# Patient Record
Sex: Female | Born: 1980 | Race: White | Hispanic: No | Marital: Married | Smoking: Never smoker
Health system: Southern US, Community
[De-identification: ages and names within clinical notes are randomized; demographics above are authoritative.]

## PROBLEM LIST (undated history)

## (undated) DIAGNOSIS — F32A Depression, unspecified: Secondary | ICD-10-CM

## (undated) DIAGNOSIS — M329 Systemic lupus erythematosus, unspecified: Secondary | ICD-10-CM

## (undated) DIAGNOSIS — R569 Unspecified convulsions: Secondary | ICD-10-CM

## (undated) DIAGNOSIS — IMO0002 Reserved for concepts with insufficient information to code with codable children: Secondary | ICD-10-CM

## (undated) DIAGNOSIS — N809 Endometriosis, unspecified: Secondary | ICD-10-CM

## (undated) DIAGNOSIS — F329 Major depressive disorder, single episode, unspecified: Secondary | ICD-10-CM

## (undated) DIAGNOSIS — L659 Nonscarring hair loss, unspecified: Secondary | ICD-10-CM

## (undated) DIAGNOSIS — R87629 Unspecified abnormal cytological findings in specimens from vagina: Secondary | ICD-10-CM

## (undated) DIAGNOSIS — D219 Benign neoplasm of connective and other soft tissue, unspecified: Secondary | ICD-10-CM

## (undated) DIAGNOSIS — D649 Anemia, unspecified: Secondary | ICD-10-CM

## (undated) DIAGNOSIS — M545 Low back pain, unspecified: Secondary | ICD-10-CM

## (undated) DIAGNOSIS — R760 Raised antibody titer: Secondary | ICD-10-CM

## (undated) HISTORY — PX: LUMBAR LAMINECTOMY: SHX95

## (undated) HISTORY — DX: Unspecified abnormal cytological findings in specimens from vagina: R87.629

## (undated) HISTORY — DX: Major depressive disorder, single episode, unspecified: F32.9

## (undated) HISTORY — DX: Low back pain, unspecified: M54.50

## (undated) HISTORY — DX: Reserved for concepts with insufficient information to code with codable children: IMO0002

## (undated) HISTORY — DX: Low back pain: M54.5

## (undated) HISTORY — DX: Endometriosis, unspecified: N80.9

## (undated) HISTORY — DX: Benign neoplasm of connective and other soft tissue, unspecified: D21.9

## (undated) HISTORY — PX: OTHER SURGICAL HISTORY: SHX169

## (undated) HISTORY — DX: Depression, unspecified: F32.A

## (undated) HISTORY — DX: Anemia, unspecified: D64.9

## (undated) HISTORY — DX: Raised antibody titer: R76.0

## (undated) HISTORY — PX: DILATION AND CURETTAGE OF UTERUS: SHX78

## (undated) HISTORY — PX: BREAST SURGERY: SHX581

## (undated) HISTORY — DX: Nonscarring hair loss, unspecified: L65.9

## (undated) HISTORY — PX: GASTRIC BYPASS: SHX52

## (undated) HISTORY — PX: BACK SURGERY: SHX140

## (undated) HISTORY — DX: Systemic lupus erythematosus, unspecified: M32.9

---

## 2013-01-04 ENCOUNTER — Encounter (INDEPENDENT_AMBULATORY_CARE_PROVIDER_SITE_OTHER): Payer: Self-pay | Admitting: Ophthalmology

## 2013-01-20 ENCOUNTER — Emergency Department (INDEPENDENT_AMBULATORY_CARE_PROVIDER_SITE_OTHER)
Admission: EM | Admit: 2013-01-20 | Discharge: 2013-01-20 | Discharge status: Against Medical Advice | Payer: BC Managed Care – PPO | Source: Home / Self Care

## 2013-01-20 ENCOUNTER — Encounter (INDEPENDENT_AMBULATORY_CARE_PROVIDER_SITE_OTHER): Payer: Self-pay | Admitting: Ophthalmology

## 2013-01-20 ENCOUNTER — Encounter (HOSPITAL_COMMUNITY): Payer: Self-pay

## 2013-01-20 DIAGNOSIS — M329 Systemic lupus erythematosus, unspecified: Secondary | ICD-10-CM

## 2013-01-20 HISTORY — DX: Reserved for concepts with insufficient information to code with codable children: IMO0002

## 2013-01-20 HISTORY — DX: Systemic lupus erythematosus, unspecified: M32.9

## 2013-01-20 LAB — POCT URINALYSIS DIP (DEVICE)
Bilirubin Urine: NEGATIVE
Glucose, UA: NEGATIVE mg/dL
Ketones, ur: >=160 mg/dL — AB
Specific Gravity, Urine: 1.02 (ref 1.005–1.030)
Urobilinogen, UA: 0.2 mg/dL (ref 0.0–1.0)

## 2013-01-20 LAB — POCT I-STAT, CHEM 8
BUN: 9 mg/dL (ref 6–23)
Calcium, Ion: 1.19 mmol/L (ref 1.12–1.23)
Chloride: 109 mEq/L (ref 96–112)
HCT: 41 % (ref 36.0–46.0)
Potassium: 3.8 mEq/L (ref 3.5–5.1)
Sodium: 138 mEq/L (ref 135–145)

## 2013-01-20 MED ORDER — PREDNISONE 20 MG PO TABS: 20.0000 mg | ORAL_TABLET | Freq: Three times a day (TID) | ORAL | Status: DC

## 2013-01-20 MED ORDER — LANSOPRAZOLE 30 MG PO CPDR: 30.0000 mg | DELAYED_RELEASE_CAPSULE | Freq: Every day | ORAL | Status: DC

## 2013-01-20 NOTE — ED Provider Notes (Signed)
History     CSN: [id number]  Arrival date & time 01/20/13  1450   None     Chief Complaint  Patient presents with  . Lupus    (Consider location/radiation/quality/duration/timing/severity/associated sxs/prior treatment) HPI Comments: Pt presents c/o severe lupus flare that started yesterday.  She has pain all over, dizziness, fatigue, confusion, and has not urinated at all today.     Past Medical History  Diagnosis Date  . Lupus     Past Surgical History  Procedure Laterality Date  . Back surgery    . Gastric bypass      History reviewed. No pertinent family history.  History  Substance Use Topics  . Smoking status: Never Smoker   . Smokeless tobacco: Not on file  . Alcohol Use: No    OB History   Grav Para Term Preterm Abortions TAB SAB Ect Mult Living                  Review of Systems  Constitutional: Positive for activity change and fatigue. Negative for fever and chills.  Eyes: Positive for visual disturbance.  Respiratory: Negative for cough and shortness of breath.   Cardiovascular: Negative for chest pain, palpitations and leg swelling.  Gastrointestinal: Positive for nausea and abdominal pain. Negative for vomiting.  Endocrine: Negative for polydipsia and polyuria.  Genitourinary: Positive for decreased urine volume. Negative for dysuria, urgency and frequency.  Musculoskeletal: Positive for myalgias and arthralgias.  Skin: Negative for rash.  Neurological: Positive for dizziness, light-headedness and headaches. Negative for weakness.  Psychiatric/Behavioral: Positive for confusion and decreased concentration.    Allergies  Review of patient's allergies indicates no known allergies.  Home Medications   Current Outpatient Rx  Name  Route  Sig  Dispense  Refill  . hydroxychloroquine (PLAQUENIL) 200 MG tablet   Oral   Take by mouth daily.         . predniSONE (DELTASONE) 10 MG tablet   Oral   Take 10 mg by mouth daily.         .  lansoprazole (PREVACID) 30 MG capsule   Oral   Take 1 capsule (30 mg total) by mouth daily.   30 capsule   0   . predniSONE (DELTASONE) 20 MG tablet   Oral   Take 1 tablet (20 mg total) by mouth 3 (three) times daily.   21 tablet   0     BP 127/81  Pulse 110  Temp(Src) 98.7 F (37.1 C) (Oral)  Resp 22  SpO2 100%  LMP 12/30/2012  Physical Exam  Nursing note and vitals reviewed. Constitutional: She is oriented to person, place, and time. Vital signs are normal. She appears well-developed and well-nourished. She appears distressed.  Eyes: EOM are normal.  Cardiovascular: Normal rate, regular rhythm and normal heart sounds.  Exam reveals no gallop and no friction rub.   No murmur heard. Pulmonary/Chest: Effort normal and breath sounds normal. No respiratory distress. She has no wheezes. She has no rales.  Neurological: She is alert and oriented to person, place, and time. She has normal strength.  Skin: Skin is warm and dry. She is not diaphoretic.  Psychiatric: She has a normal mood and affect. Her behavior is normal.    ED Course  Procedures (including critical care time)  Labs Reviewed  POCT URINALYSIS DIP (DEVICE) - Abnormal; Notable for the following:    Ketones, ur >=160 (*)    Hgb urine dipstick TRACE (*)    Leukocytes, UA  TRACE (*)    All other components within normal limits  POCT I-STAT, CHEM 8   No results found.   1. Exacerbation of systemic lupus erythematosus       MDM   Discussed with rheumatologist who recommended admission.  Pt decided against this and signed out AMA.  She says she will f/u with her rheumatologist on Monday.  Told her she may come back or go to ER if she needs to.  Will still attempt to treat her with increased dose of prednisone although made it plain and clear to her and her family that the recommendation of her rheumatologist and myself is that she needs to be treated in the hospital.  She accepts all risks associated with this  and still will not go to hospital.    Meds ordered this encounter  Medications  .       .       . predniSONE (DELTASONE) 20 MG tablet    Sig: Take 1 tablet (20 mg total) by mouth 3 (three) times daily.    Dispense:  21 tablet    Refill:  0  . lansoprazole (PREVACID) 30 MG capsule    Sig: Take 1 capsule (30 mg total) by mouth daily.    Dispense:  30 capsule    Refill:  0          Graylon Good, PA-C 01/20/13 [phone]

## 2013-01-20 NOTE — ED Notes (Signed)
States she is having a flare up of her lupus since last PM, getting worse. Almost fainted after she took hot shower

## 2013-01-21 NOTE — ED Provider Notes (Signed)
Medical screening examination/treatment/procedure(s) were performed by resident physician or non-physician practitioner and as supervising physician I was immediately available for consultation/collaboration.   Barkley Bruns MD.   Linna Hoff, MD 01/21/13 [phone]

## 2013-03-30 ENCOUNTER — Ambulatory Visit (INDEPENDENT_AMBULATORY_CARE_PROVIDER_SITE_OTHER): Payer: BC Managed Care – PPO | Admitting: Licensed Clinical Social Worker

## 2013-03-30 DIAGNOSIS — F411 Generalized anxiety disorder: Secondary | ICD-10-CM

## 2013-03-30 DIAGNOSIS — F331 Major depressive disorder, recurrent, moderate: Secondary | ICD-10-CM

## 2013-04-10 ENCOUNTER — Ambulatory Visit (INDEPENDENT_AMBULATORY_CARE_PROVIDER_SITE_OTHER): Payer: BC Managed Care – PPO | Admitting: Licensed Clinical Social Worker

## 2013-04-10 DIAGNOSIS — F411 Generalized anxiety disorder: Secondary | ICD-10-CM

## 2013-04-10 DIAGNOSIS — F331 Major depressive disorder, recurrent, moderate: Secondary | ICD-10-CM

## 2013-04-27 ENCOUNTER — Ambulatory Visit: Payer: BC Managed Care – PPO | Admitting: Licensed Clinical Social Worker

## 2013-05-02 ENCOUNTER — Institutional Professional Consult (permissible substitution): Payer: Self-pay | Admitting: Cardiology

## 2013-05-09 ENCOUNTER — Encounter: Payer: Self-pay | Admitting: Gastroenterology

## 2013-05-14 ENCOUNTER — Encounter (HOSPITAL_COMMUNITY): Payer: Self-pay | Admitting: Emergency Medicine

## 2013-05-14 ENCOUNTER — Emergency Department (HOSPITAL_COMMUNITY)
Admission: EM | Admit: 2013-05-14 | Discharge: 2013-05-14 | Disposition: A | Discharge status: Home / Self Care | Payer: BC Managed Care – PPO | Attending: Emergency Medicine | Admitting: Emergency Medicine

## 2013-05-14 ENCOUNTER — Emergency Department (HOSPITAL_COMMUNITY): Payer: BC Managed Care – PPO

## 2013-05-14 DIAGNOSIS — Z79899 Other long term (current) drug therapy: Secondary | ICD-10-CM | POA: Insufficient documentation

## 2013-05-14 DIAGNOSIS — M545 Low back pain, unspecified: Secondary | ICD-10-CM

## 2013-05-14 DIAGNOSIS — W19XXXA Unspecified fall, initial encounter: Secondary | ICD-10-CM

## 2013-05-14 DIAGNOSIS — Y9389 Activity, other specified: Secondary | ICD-10-CM | POA: Insufficient documentation

## 2013-05-14 DIAGNOSIS — W11XXXA Fall on and from ladder, initial encounter: Secondary | ICD-10-CM | POA: Insufficient documentation

## 2013-05-14 DIAGNOSIS — IMO0002 Reserved for concepts with insufficient information to code with codable children: Secondary | ICD-10-CM | POA: Insufficient documentation

## 2013-05-14 DIAGNOSIS — S8990XA Unspecified injury of unspecified lower leg, initial encounter: Secondary | ICD-10-CM | POA: Insufficient documentation

## 2013-05-14 DIAGNOSIS — Z791 Long term (current) use of non-steroidal anti-inflammatories (NSAID): Secondary | ICD-10-CM | POA: Insufficient documentation

## 2013-05-14 DIAGNOSIS — Z872 Personal history of diseases of the skin and subcutaneous tissue: Secondary | ICD-10-CM | POA: Insufficient documentation

## 2013-05-14 DIAGNOSIS — Y92009 Unspecified place in unspecified non-institutional (private) residence as the place of occurrence of the external cause: Secondary | ICD-10-CM | POA: Insufficient documentation

## 2013-05-14 HISTORY — DX: Systemic lupus erythematosus, unspecified: M32.9

## 2013-05-14 MED ORDER — CYCLOBENZAPRINE HCL 10 MG PO TABS: 5.0000 mg | ORAL_TABLET | Freq: Two times a day (BID) | ORAL | Status: DC | PRN

## 2013-05-14 MED ORDER — CYCLOBENZAPRINE HCL 10 MG PO TABS
5.0000 mg | ORAL_TABLET | Freq: Once | ORAL | Status: AC
  Administered 2013-05-14: 5 mg via ORAL
  Filled 2013-05-14: qty 1

## 2013-05-14 MED ORDER — HYDROCODONE-ACETAMINOPHEN 5-325 MG PO TABS: 1.0000 | ORAL_TABLET | ORAL | Status: DC | PRN

## 2013-05-14 MED ORDER — HYDROCODONE-ACETAMINOPHEN 5-325 MG PO TABS
1.0000 | ORAL_TABLET | Freq: Once | ORAL | Status: AC
  Administered 2013-05-14: 1 via ORAL
  Filled 2013-05-14: qty 1

## 2013-05-14 MED ORDER — ONDANSETRON 4 MG PO TBDP
4.0000 mg | ORAL_TABLET | Freq: Once | ORAL | Status: AC
  Administered 2013-05-14: 4 mg via ORAL
  Filled 2013-05-14: qty 1

## 2013-05-14 NOTE — ED Provider Notes (Signed)
Medical screening examination/treatment/procedure(s) were performed by non-physician practitioner and as supervising physician I was immediately available for consultation/collaboration.   Kjirsten Bloodgood B. Bernette Mayers, MD 05/14/13 1929

## 2013-05-14 NOTE — ED Provider Notes (Signed)
CSN: [id number]     Arrival date & time 05/14/13  1736 History  This chart was scribed for Kathleen Pel, PA, working with Leonette Most B. Kathleen Mayers, MD by Blanchard Kelch, ED Scribe. This patient was seen in room WTR5/WTR5 and the patient's care was started at 7:13 PM.     Chief Complaint  Patient presents with  . Fall    Patient is a 32 y.o. female presenting with fall. The history is provided by the patient. No language interpreter was used.  Fall    HPI Comments: Kathleen Ashley is a 32 y.o. female who presents to the Emergency Department due to a fall off a ladder while she was painting just prior to arrival. She is now complaining of sudden, constant right lower back and right leg pain onset after the fall. The pain is worse in her leg when she walks or puts any pressure on it. She has a past surgical history on her back. Dr. Yetta Barre performed her surgery. Denies head injury, neck pain. Or loc. She is able to walk. No loss of bowel or urine control. Pt is concerned that she may have "messed up her back surgery".     Past Medical History  Diagnosis Date  . Lupus    Past Surgical History  Procedure Laterality Date  . Back surgery     No family history on file. History  Substance Use Topics  . Smoking status: Never Smoker   . Smokeless tobacco: Not on file  . Alcohol Use: No   OB History   Grav Para Term Preterm Abortions TAB SAB Ect Mult Living                 Review of Systems  Musculoskeletal: Positive for back pain.  All other systems reviewed and are negative.    Allergies  Review of patient's allergies indicates no known allergies.  Home Medications   Current Outpatient Rx  Name  Route  Sig  Dispense  Refill  . acetaminophen (TYLENOL) 500 MG tablet   Oral   Take 1,000 mg by mouth every 6 (six) hours as needed for pain.         . celecoxib (CELEBREX) 200 MG capsule   Oral   Take 200 mg by mouth 2 (two) times daily.         . hydroxychloroquine (PLAQUENIL)  200 MG tablet   Oral   Take 400 mg by mouth at bedtime.         . Multiple Vitamin (MULTIVITAMIN WITH MINERALS) TABS tablet   Oral   Take 1 tablet by mouth daily.          Triage Vitals: BP 130/70  Pulse 105  Temp(Src) 98.7 F (37.1 C) (Oral)  Resp 18  SpO2 99%  LMP 04/29/2013  Physical Exam  Nursing note and vitals reviewed. Constitutional: She is oriented to person, place, and time. She appears well-developed and well-nourished. No distress.  HENT:  Head: Normocephalic and atraumatic.  Eyes: EOM are normal.  Neck: Neck supple. No tracheal deviation present.  Cardiovascular: Normal rate.   Pulmonary/Chest: Effort normal. No respiratory distress.  Musculoskeletal: Normal range of motion. She exhibits tenderness.  Bilateral paraspinal tenderness. No deformity of spine. No step off. No midline tenderness.   Neurological: She is alert and oriented to person, place, and time. She has normal strength. No sensory deficit.  Skin: Skin is warm and dry.  Psychiatric: She has a normal mood and affect. Her behavior is normal.  ED Course  Procedures (including critical care time)  DIAGNOSTIC STUDIES: Oxygen Saturation is 99% on room air, normal by my interpretation.    COORDINATION OF CARE:  7:15 PM -Will order muscle relaxer and pain medication. Recommend follow up with Dr. Yetta Barre. Patient verbalizes understanding and agrees with treatment plan.   Labs Review Labs Reviewed - No data to display Imaging Review Dg Lumbar Spine Complete  05/14/2013   CLINICAL DATA:  Low back pain  EXAM: LUMBAR SPINE - COMPLETE 4+ VIEW  COMPARISON:  None.  FINDINGS: There is no evidence of lumbar spine fracture. Alignment is normal. L5-S1 degenerative disc disease noted.  IMPRESSION: 1. No acute findings.  2. L5-S1 degenerative disc disease   Electronically Signed   By: Signa Kell M.D.   On: 05/14/2013 18:38    MDM   1. Fall at home, initial encounter   2. Low back pain     Equal  strength to bilateral lower extremities. Neurosensory function adequate to both legs. Skin color is normal. Skin is warm and moist. I see no step off deformity, no bony tenderness. Pt is able to ambulate without limp. Pain is relieved when sitting in certain positions. ROM is decreased due to pain. No crepitus, laceration, effusion, swelling.  Pulses are normal   31 y.o.Kathleen Ashley's evaluation in the Emergency Department is complete. It has been determined that no acute conditions requiring further emergency intervention are present at this time. The patient/guardian have been advised of the diagnosis and plan. We have discussed signs and symptoms that warrant return to the ED, such as changes or worsening in symptoms.  Vital signs are stable at discharge. Filed Vitals:   05/14/13 1814  BP: 130/70  Pulse: 105  Temp: 98.7 F (37.1 C)  Resp: 18    Patient/guardian has voiced understanding and agreed to follow-up with the PCP or specialist.  I personally performed the services described in this documentation, which was scribed in my presence. The recorded information has been reviewed and is accurate.    Dorthula Matas, PA-C 05/14/13 1920

## 2013-05-14 NOTE — ED Notes (Signed)
Per pt, was on last step of ladder and fell on right side-now having back pain-recent back surgery

## 2013-05-14 NOTE — ED Notes (Signed)
Patient transported to X-ray 

## 2013-06-27 ENCOUNTER — Institutional Professional Consult (permissible substitution): Payer: Self-pay | Admitting: Cardiology

## 2013-07-03 ENCOUNTER — Ambulatory Visit: Payer: BC Managed Care – PPO | Admitting: Gastroenterology

## 2013-12-06 ENCOUNTER — Ambulatory Visit (INDEPENDENT_AMBULATORY_CARE_PROVIDER_SITE_OTHER): Payer: BC Managed Care – PPO | Admitting: Licensed Clinical Social Worker

## 2013-12-06 DIAGNOSIS — F411 Generalized anxiety disorder: Secondary | ICD-10-CM

## 2013-12-06 DIAGNOSIS — F331 Major depressive disorder, recurrent, moderate: Secondary | ICD-10-CM

## 2013-12-12 ENCOUNTER — Ambulatory Visit (INDEPENDENT_AMBULATORY_CARE_PROVIDER_SITE_OTHER): Payer: BC Managed Care – PPO | Admitting: Licensed Clinical Social Worker

## 2013-12-12 DIAGNOSIS — F411 Generalized anxiety disorder: Secondary | ICD-10-CM

## 2013-12-12 DIAGNOSIS — F331 Major depressive disorder, recurrent, moderate: Secondary | ICD-10-CM

## 2013-12-20 ENCOUNTER — Ambulatory Visit: Payer: BC Managed Care – PPO | Admitting: Licensed Clinical Social Worker

## 2013-12-21 ENCOUNTER — Ambulatory Visit (INDEPENDENT_AMBULATORY_CARE_PROVIDER_SITE_OTHER): Payer: BC Managed Care – PPO | Admitting: Licensed Clinical Social Worker

## 2013-12-21 DIAGNOSIS — F411 Generalized anxiety disorder: Secondary | ICD-10-CM

## 2013-12-21 DIAGNOSIS — F331 Major depressive disorder, recurrent, moderate: Secondary | ICD-10-CM

## 2014-01-05 ENCOUNTER — Ambulatory Visit: Payer: BC Managed Care – PPO | Admitting: Licensed Clinical Social Worker

## 2014-01-16 ENCOUNTER — Ambulatory Visit: Payer: BC Managed Care – PPO | Admitting: Licensed Clinical Social Worker

## 2014-01-17 ENCOUNTER — Ambulatory Visit: Payer: BC Managed Care – PPO | Admitting: Licensed Clinical Social Worker

## 2014-01-24 ENCOUNTER — Ambulatory Visit: Payer: BC Managed Care – PPO | Admitting: Family

## 2014-01-24 ENCOUNTER — Ambulatory Visit (INDEPENDENT_AMBULATORY_CARE_PROVIDER_SITE_OTHER): Payer: BC Managed Care – PPO | Admitting: Licensed Clinical Social Worker

## 2014-01-24 DIAGNOSIS — F411 Generalized anxiety disorder: Secondary | ICD-10-CM

## 2014-01-24 DIAGNOSIS — F331 Major depressive disorder, recurrent, moderate: Secondary | ICD-10-CM

## 2014-01-30 ENCOUNTER — Ambulatory Visit: Payer: BC Managed Care – PPO | Admitting: Licensed Clinical Social Worker

## 2014-01-31 ENCOUNTER — Ambulatory Visit (INDEPENDENT_AMBULATORY_CARE_PROVIDER_SITE_OTHER): Payer: BC Managed Care – PPO | Admitting: Licensed Clinical Social Worker

## 2014-01-31 DIAGNOSIS — F331 Major depressive disorder, recurrent, moderate: Secondary | ICD-10-CM

## 2014-01-31 DIAGNOSIS — F411 Generalized anxiety disorder: Secondary | ICD-10-CM

## 2014-02-08 ENCOUNTER — Ambulatory Visit: Payer: BC Managed Care – PPO | Admitting: Family

## 2014-02-08 ENCOUNTER — Ambulatory Visit: Payer: BC Managed Care – PPO | Admitting: Licensed Clinical Social Worker

## 2014-02-08 DIAGNOSIS — Z0289 Encounter for other administrative examinations: Secondary | ICD-10-CM

## 2014-02-14 ENCOUNTER — Ambulatory Visit (INDEPENDENT_AMBULATORY_CARE_PROVIDER_SITE_OTHER): Payer: BC Managed Care – PPO | Admitting: Licensed Clinical Social Worker

## 2014-02-14 DIAGNOSIS — F331 Major depressive disorder, recurrent, moderate: Secondary | ICD-10-CM

## 2014-02-14 DIAGNOSIS — F411 Generalized anxiety disorder: Secondary | ICD-10-CM

## 2014-02-20 ENCOUNTER — Ambulatory Visit (INDEPENDENT_AMBULATORY_CARE_PROVIDER_SITE_OTHER): Payer: BC Managed Care – PPO | Admitting: Licensed Clinical Social Worker

## 2014-02-20 DIAGNOSIS — F331 Major depressive disorder, recurrent, moderate: Secondary | ICD-10-CM

## 2014-02-20 DIAGNOSIS — F411 Generalized anxiety disorder: Secondary | ICD-10-CM

## 2014-04-03 ENCOUNTER — Ambulatory Visit: Payer: BC Managed Care – PPO | Admitting: Licensed Clinical Social Worker

## 2014-06-06 ENCOUNTER — Ambulatory Visit: Payer: BC Managed Care – PPO | Admitting: Neurology

## 2014-09-06 ENCOUNTER — Ambulatory Visit: Payer: Self-pay | Admitting: Licensed Clinical Social Worker

## 2014-12-11 ENCOUNTER — Ambulatory Visit: Payer: Self-pay | Admitting: Licensed Clinical Social Worker

## 2014-12-18 ENCOUNTER — Ambulatory Visit (INDEPENDENT_AMBULATORY_CARE_PROVIDER_SITE_OTHER): Payer: BLUE CROSS/BLUE SHIELD | Admitting: Licensed Clinical Social Worker

## 2014-12-18 DIAGNOSIS — F411 Generalized anxiety disorder: Secondary | ICD-10-CM | POA: Diagnosis not present

## 2014-12-18 DIAGNOSIS — F332 Major depressive disorder, recurrent severe without psychotic features: Secondary | ICD-10-CM | POA: Diagnosis not present

## 2015-05-01 ENCOUNTER — Ambulatory Visit: Payer: BLUE CROSS/BLUE SHIELD | Admitting: Licensed Clinical Social Worker

## 2015-07-09 ENCOUNTER — Other Ambulatory Visit: Payer: Self-pay | Admitting: Orthopedic Surgery

## 2015-07-09 DIAGNOSIS — M5417 Radiculopathy, lumbosacral region: Secondary | ICD-10-CM

## 2015-07-11 ENCOUNTER — Other Ambulatory Visit: Payer: Self-pay | Admitting: Rheumatology

## 2015-07-11 ENCOUNTER — Ambulatory Visit
Admission: RE | Admit: 2015-07-11 | Discharge: 2015-07-11 | Disposition: A | Discharge status: Home / Self Care | Payer: BLUE CROSS/BLUE SHIELD | Source: Ambulatory Visit | Attending: Rheumatology | Admitting: Rheumatology

## 2015-07-11 DIAGNOSIS — Z5181 Encounter for therapeutic drug level monitoring: Secondary | ICD-10-CM

## 2015-07-11 DIAGNOSIS — Z79899 Other long term (current) drug therapy: Principal | ICD-10-CM

## 2015-07-13 ENCOUNTER — Ambulatory Visit
Admission: RE | Admit: 2015-07-13 | Discharge: 2015-07-13 | Disposition: A | Discharge status: Home / Self Care | Payer: BLUE CROSS/BLUE SHIELD | Source: Ambulatory Visit | Attending: Orthopedic Surgery | Admitting: Orthopedic Surgery

## 2015-07-13 DIAGNOSIS — M5417 Radiculopathy, lumbosacral region: Secondary | ICD-10-CM

## 2015-07-14 ENCOUNTER — Other Ambulatory Visit: Payer: Self-pay

## 2015-08-31 ENCOUNTER — Encounter (HOSPITAL_COMMUNITY): Payer: Self-pay | Admitting: Emergency Medicine

## 2015-08-31 ENCOUNTER — Emergency Department (HOSPITAL_COMMUNITY): Payer: BLUE CROSS/BLUE SHIELD

## 2015-08-31 ENCOUNTER — Emergency Department (HOSPITAL_COMMUNITY)
Admission: EM | Admit: 2015-08-31 | Discharge: 2015-08-31 | Disposition: A | Discharge status: Home / Self Care | Payer: BLUE CROSS/BLUE SHIELD | Attending: Emergency Medicine | Admitting: Emergency Medicine

## 2015-08-31 DIAGNOSIS — Y998 Other external cause status: Secondary | ICD-10-CM | POA: Diagnosis not present

## 2015-08-31 DIAGNOSIS — W000XXA Fall on same level due to ice and snow, initial encounter: Secondary | ICD-10-CM | POA: Insufficient documentation

## 2015-08-31 DIAGNOSIS — Z3202 Encounter for pregnancy test, result negative: Secondary | ICD-10-CM | POA: Diagnosis not present

## 2015-08-31 DIAGNOSIS — R159 Full incontinence of feces: Secondary | ICD-10-CM | POA: Diagnosis not present

## 2015-08-31 DIAGNOSIS — W009XXA Unspecified fall due to ice and snow, initial encounter: Secondary | ICD-10-CM

## 2015-08-31 DIAGNOSIS — S3992XA Unspecified injury of lower back, initial encounter: Secondary | ICD-10-CM | POA: Diagnosis present

## 2015-08-31 DIAGNOSIS — R2689 Other abnormalities of gait and mobility: Secondary | ICD-10-CM | POA: Insufficient documentation

## 2015-08-31 DIAGNOSIS — R531 Weakness: Secondary | ICD-10-CM | POA: Insufficient documentation

## 2015-08-31 DIAGNOSIS — Y93K1 Activity, walking an animal: Secondary | ICD-10-CM | POA: Insufficient documentation

## 2015-08-31 DIAGNOSIS — M5441 Lumbago with sciatica, right side: Secondary | ICD-10-CM | POA: Insufficient documentation

## 2015-08-31 DIAGNOSIS — Z79899 Other long term (current) drug therapy: Secondary | ICD-10-CM | POA: Diagnosis not present

## 2015-08-31 DIAGNOSIS — Y9289 Other specified places as the place of occurrence of the external cause: Secondary | ICD-10-CM | POA: Diagnosis not present

## 2015-08-31 LAB — POC URINE PREG, ED: Preg Test, Ur: NEGATIVE

## 2015-08-31 MED ORDER — METHOCARBAMOL 500 MG PO TABS: 500.0000 mg | ORAL_TABLET | Freq: Two times a day (BID) | ORAL | Status: DC

## 2015-08-31 MED ORDER — LORAZEPAM 1 MG PO TABS
1.0000 mg | ORAL_TABLET | Freq: Once | ORAL | Status: AC
  Administered 2015-08-31: 1 mg via ORAL
  Filled 2015-08-31: qty 1

## 2015-08-31 MED ORDER — OXYCODONE-ACETAMINOPHEN 5-325 MG PO TABS
2.0000 | ORAL_TABLET | ORAL | Status: DC | PRN
Start: 2015-08-31 — End: 2017-08-12

## 2015-08-31 MED ORDER — ONDANSETRON HCL 4 MG/2ML IJ SOLN
4.0000 mg | Freq: Once | INTRAMUSCULAR | Status: AC
  Administered 2015-08-31: 4 mg via INTRAVENOUS
  Filled 2015-08-31: qty 2

## 2015-08-31 MED ORDER — MORPHINE SULFATE (PF) 4 MG/ML IV SOLN
4.0000 mg | Freq: Once | INTRAVENOUS | Status: AC
  Administered 2015-08-31: 4 mg via INTRAVENOUS
  Filled 2015-08-31: qty 1

## 2015-08-31 MED ORDER — ONDANSETRON HCL 4 MG/2ML IJ SOLN
4.0000 mg | Freq: Once | INTRAMUSCULAR | Status: AC
Start: 2015-08-31 — End: 2015-08-31
  Administered 2015-08-31: 4 mg via INTRAVENOUS
  Filled 2015-08-31: qty 2

## 2015-08-31 MED ORDER — LORAZEPAM 2 MG/ML IJ SOLN: 1.0000 mg | Freq: Once | INTRAMUSCULAR | Status: DC

## 2015-08-31 MED ORDER — IBUPROFEN 800 MG PO TABS: 800.0000 mg | ORAL_TABLET | Freq: Three times a day (TID) | ORAL | Status: AC

## 2015-08-31 MED ORDER — LORAZEPAM 2 MG/ML IJ SOLN
0.5000 mg | Freq: Once | INTRAMUSCULAR | Status: AC
  Administered 2015-08-31: 0.5 mg via INTRAVENOUS
  Filled 2015-08-31: qty 1

## 2015-08-31 MED ORDER — METHOCARBAMOL 500 MG PO TABS
500.0000 mg | ORAL_TABLET | Freq: Once | ORAL | Status: AC
  Administered 2015-08-31: 500 mg via ORAL
  Filled 2015-08-31: qty 1

## 2015-08-31 MED ORDER — OXYCODONE-ACETAMINOPHEN 5-325 MG PO TABS
2.0000 | ORAL_TABLET | Freq: Once | ORAL | Status: AC
  Administered 2015-08-31: 2 via ORAL
  Filled 2015-08-31: qty 2

## 2015-08-31 NOTE — ED Notes (Signed)
Patient receiving PO challenge

## 2015-08-31 NOTE — ED Notes (Signed)
Bed: WA14 Expected date:  Expected time:  Means of arrival:  Comments: Hold for triage 8 

## 2015-08-31 NOTE — ED Notes (Signed)
Patient transported to X-ray 

## 2015-08-31 NOTE — ED Provider Notes (Signed)
CSN: [id number]     Arrival date & time 08/31/15  1401 History   By signing my name below, I, Altamease Oiler, attest that this documentation has been prepared under the direction and in the presence of [employer]. Electronically Signed: Altamease Oiler, ED Scribe. 08/31/2015 3:47 PM   Chief Complaint  Patient presents with  . Fall   The history is provided by the patient and medical records. No language interpreter was used.   Kathleen Ashley is a 35 y.o. female with history of DDD and lupus who presents to the Emergency Department complaining of a fall 20 PTA. Pt states that she slipped and fell backwards in the snow while walking her dog.  Associated symptoms include lower right back pain that radiates down the leg, tingling in the right leg, numbness at the lower right leg, and an episode of fecal incontinence. Pt states that she had a single episode of loose stool that she could not control. She denies having diarrhea prior to the fall. Advil and Motrin provided insufficient pain relief at home. Pt is wearing a back brace. Past surgical history includes lumbar discectomy 5 years ago.  Pt denies urinary incontinence and LE weakness.   Past Medical History  Diagnosis Date  . Lupus ([place])   . DDD (degenerative disc disease)    Past Surgical History  Procedure Laterality Date  . Back surgery     No family history on file. Social History  Substance Use Topics  . Smoking status: Never Smoker   . Smokeless tobacco: None  . Alcohol Use: No   OB History    No data available     Review of Systems  Constitutional: Negative for fever, diaphoresis, appetite change, fatigue and unexpected weight change.  HENT: Negative for mouth sores.   Eyes: Negative for visual disturbance.  Respiratory: Negative for cough, chest tightness, shortness of breath and wheezing.   Cardiovascular: Negative for chest pain.  Gastrointestinal: Negative for nausea, vomiting, abdominal pain, diarrhea and  constipation.  Endocrine: Negative for polydipsia, polyphagia and polyuria.  Genitourinary: Negative for dysuria, urgency, frequency and hematuria.  Musculoskeletal: Positive for back pain. Negative for neck stiffness.  Skin: Negative for rash and wound.  Allergic/Immunologic: Negative for immunocompromised state.  Neurological: Positive for numbness. Negative for syncope, weakness, light-headedness and headaches.       Fecal incontinence x1 RLE tingling  Hematological: Does not bruise/bleed easily.  Psychiatric/Behavioral: Negative for sleep disturbance. The patient is not nervous/anxious.     Allergies  Celebrex  Home Medications   Prior to Admission medications   Medication Sig Start Date End Date Taking? Authorizing Provider  acetaminophen (TYLENOL) 500 MG tablet Take 1,000 mg by mouth every 6 (six) hours as needed for pain.   Yes Historical Provider, MD  albuterol (PROVENTIL) (2.5 MG/3ML) 0.083% nebulizer solution Inhale 3 mLs into the lungs daily as needed for wheezing or shortness of breath.  08/28/15  Yes Historical Provider, MD  Cholecalciferol (VITAMIN D PO) Take 1 tablet by mouth daily.   Yes Historical Provider, MD  COD LIVER OIL PO Take 1 capsule by mouth daily.   Yes Historical Provider, MD  Dextromethorphan-Guaifenesin [facility] DM PO) Take 1 tablet by mouth 2 (two) times daily as needed (cold symptoms).   Yes Historical Provider, MD  ferrous fumarate (HEMOCYTE - 106 MG FE) 325 (106 Fe) MG TABS tablet Take 1 tablet by mouth daily.   Yes Historical Provider, MD  MAGNESIUM PO Take 1 tablet by mouth  daily.   Yes Historical Provider, MD  Multiple Vitamin (MULTIVITAMIN WITH MINERALS) TABS tablet Take 1 tablet by mouth daily.   Yes Historical Provider, MD  Probiotic CAPS Take 1 capsule by mouth daily.   Yes Historical Provider, MD  TURMERIC PO Take 1 capsule by mouth daily.   Yes Historical Provider, MD  hydroxychloroquine (PLAQUENIL) 200 MG tablet Take 400 mg by mouth at bedtime.     Historical Provider, MD  ibuprofen (ADVIL,MOTRIN) 800 MG tablet Take 1 tablet (800 mg total) by mouth 3 (three) times daily. 08/31/15   Ryiah Bellissimo, PA-C  methocarbamol (ROBAXIN) 500 MG tablet Take 1 tablet (500 mg total) by mouth 2 (two) times daily. 08/31/15   Ryonna Cimini, PA-C  oxyCODONE-acetaminophen (PERCOCET/ROXICET) 5-325 MG tablet Take 2 tablets by mouth every 4 (four) hours as needed for severe pain. 08/31/15   Faiz Weber, PA-C   BP 116/59 mmHg  Pulse 103  Temp(Src) 98.1 F (36.7 C) (Oral)  Resp 18  SpO2 100%  LMP 08/10/2015 Physical Exam  Constitutional: She appears well-developed and well-nourished. No distress.  HENT:  Head: Normocephalic and atraumatic.  Mouth/Throat: Oropharynx is clear and moist. No oropharyngeal exudate.  Eyes: Conjunctivae are normal.  Neck: Normal range of motion. Neck supple.  Full ROM without pain  Cardiovascular: Normal rate, regular rhythm, normal heart sounds and intact distal pulses.   No murmur heard. Pulses:      Radial pulses are 2+ on the right side, and 2+ on the left side.       Dorsalis pedis pulses are 2+ on the right side, and 2+ on the left side.  Pulmonary/Chest: Effort normal and breath sounds normal. No respiratory distress. She has no wheezes.  Abdominal: Soft. She exhibits no distension. There is no tenderness.  Genitourinary:  [id number] rectal tone and moderate saddle anaesthesia.   Musculoskeletal:  Limited ROM of the lumbar spine due to pain.  Midline TTP and bilateral paraspinal tenderness to the lumbar spine. No ecchymosis, deformity, or step off. Well healed surgical incision over the lumbar spine.   Lymphadenopathy:    She has no cervical adenopathy.  Neurological: She is alert. She has normal reflexes.  Reflex Scores:      Bicep reflexes are 2+ on the right side and 2+ on the left side.      Brachioradialis reflexes are 2+ on the right side and 2+ on the left side.      Patellar reflexes  are 2+ on the right side and 2+ on the left side.      Achilles reflexes are 2+ on the right side and 2+ on the left side. Speech is clear and goal oriented, follows commands Normal 5/5 strength in upper extremities bilaterally including strong and equal grip strength 5/5 strength in LLE including dorsiflexion and plantar flexion 4/5 strength in RLE including dorsiflexion and plantar flexion.  Sensation normal to light and sharp touch Moves extremities without ataxia, coordination intact Normal gait, patient not picking up right leg and antalgic gait  No Clonus   Skin: Skin is warm and dry. No rash noted. She is not diaphoretic. No erythema.  Psychiatric: She has a normal mood and affect. Her behavior is normal.  Nursing note and vitals reviewed.   ED Course  Procedures (including critical care time) DIAGNOSTIC STUDIES: Oxygen Saturation is 100% on RA,  normal by my interpretation.    COORDINATION OF CARE: 2:32 PM Discussed treatment plan which includes Xr of the lumbar spine and Percocet  with pt at bedside and pt agreed to plan.  Labs Review Labs Reviewed  POC URINE PREG, ED    Imaging Review Dg Lumbar Spine Complete  08/31/2015  CLINICAL DATA:  Right-sided lumbar spine pain extending into right leg after falling on steps today EXAM: LUMBAR SPINE - COMPLETE 4+ VIEW COMPARISON:  07/13/2015 MRI FINDINGS: Normal alignment. Minimal spondylosis throughout the lumbar spine with mild to moderate degenerative disc disease at L5-S1. No fracture. IMPRESSION: Degenerative changes with no acute findings Electronically Signed   By: Skipper Cliche M.D.   On: 08/31/2015 16:07   Mr Lumbar Spine Wo Contrast  08/31/2015  CLINICAL DATA:  [place] backwards in the snow while walking the dog. Right low back pain radiating down the leg with tingling and numbness in the right leg. Episode of fecal incontinence. Decreased rectal tone and mild saddle anesthesia. Prior lumbar discectomy 5 years ago. EXAM: MRI  LUMBAR SPINE WITHOUT CONTRAST TECHNIQUE: Multiplanar, multisequence MR imaging of the lumbar spine was performed. No intravenous contrast was administered. COMPARISON:  07/13/2015 FINDINGS: There is trace retrolisthesis of L5 on S1, not significantly changed. Disc desiccation and moderate disc space height loss are again seen at L5-S1 with evidence of vacuum disc phenomenon. Intervertebral discs elsewhere in the lumbar spine are well hydrated and maintained in height. Vertebral body heights are preserved. There is minimal degenerative endplate edema at 075-GRM. No marrow edema suggestive of an acute osseous injury is identified. The conus medullaris is normal in signal and terminates at L1. The uterus is retroflexed with an approximately 3 cm intramural fibroid in the fundus. The bladder is only partially visualized but appears moderately distended. L1-2 and L2-3:  Negative. L3-4: At most minimal facet degeneration without disc herniation or stenosis, unchanged. L4-5: At most minimal disc bulging and facet degeneration without stenosis, unchanged. L5-S1: Prior right laminectomy again noted. Mild disc bulging and shallow central/ left central disc protrusion without significant stenosis, unchanged. Disc approaches but does not appear to displace or compress the left S1 nerve root in the lateral recess. IMPRESSION: 1. Unchanged appearance of the lumbar spine. 2. Postoperative changes and disc degeneration at L5-S1 without evidence of neural impingement. 3. 3 cm uterine fibroid. Electronically Signed   By: Logan Bores M.D.   On: 08/31/2015 18:54   I have personally reviewed and evaluated these images as part of my medical decision-making.   EKG Interpretation None      MDM   Final diagnoses:  Right-sided low back pain with right-sided sciatica  Fall from slipping on ice, initial encounter    Kathleen Ashley presents with right sided low back pain after falling.  On exam patient with gait abnormality,  decreased rectal tone and weakness in the right leg. Patient also with one episode of fecal incontinence prior to arrival.  Concern for cauda equina however MRI is without acute abnormalities. No evidence of spinal cord compression. Postoperative changes are noted but no evidence of neural impingement. Patient's pain is improved and her ambulation is also improved. No further episodes of fecal incontinence. Patient is able to urinate here in the emergency room and there have been no episodes of urinary incontinence.  Will discharge home with symptomatic therapy.    BP 108/65 mmHg  Pulse 92  Temp(Src) 98.1 F (36.7 C) (Oral)  Resp 14  SpO2 100%  LMP 08/06/2015   The patient was discussed with  Dr. Lita Mains who agrees with the treatment plan.  I personally performed the services described in this  documentation, which was scribed in my presence. The recorded information has been reviewed and is accurate.   Jarrett Soho Sharen Youngren, PA-C 08/31/15 2041  Julianne Rice, MD 08/31/15 2049

## 2015-08-31 NOTE — ED Notes (Signed)
Patient transported to MRI 

## 2015-08-31 NOTE — Discharge Instructions (Signed)
1. Medications: robaxin, ibuprofen, percocet, usual home medications 2. Treatment: rest, drink plenty of fluids, gentle stretching as discussed, alternate ice and heat 3. Follow Up: Please followup with your primary doctor in 3 days for discussion of your diagnoses and further evaluation after today's visit; if you do not have a primary care doctor use the resource guide provided to find one;  Return to the ER for worsening back pain, difficulty walking, loss of bowel or bladder control or other concerning symptoms     Emergency Department Resource Guide 1) Find a Doctor and Pay Out of Pocket Although you won't have to find out who is covered by your insurance plan, it is a good idea to ask around and get recommendations. You will then need to call the office and see if the doctor you have chosen will accept you as a new patient and what types of options they offer for patients who are self-pay. Some doctors offer discounts or will set up payment plans for their patients who do not have insurance, but you will need to ask so you aren't surprised when you get to your appointment.  2) Contact Your Local Health Department Not all health departments have doctors that can see patients for sick visits, but many do, so it is worth a call to see if yours does. If you don't know where your local health department is, you can check in your phone book. The CDC also has a tool to help you locate your state's health department, and many state websites also have listings of all of their local health departments.  3) Find a [facility] If your illness is not likely to be very severe or complicated, you may want to try a walk in clinic. These are popping up all over the country in pharmacies, drugstores, and shopping centers. They're usually staffed by nurse practitioners or physician assistants that have been trained to treat common illnesses and complaints. They're usually fairly quick and inexpensive. However,  if you have serious medical issues or chronic medical problems, these are probably not your best option.  No Primary Care Doctor: - Call Health Connect at  [phone] - they can help you locate a primary care doctor that  accepts your insurance, provides certain services, etc. - Physician Referral Service- [phone]  Chronic Pain Problems: Organization         Address  Phone   Notes  [facility]  [phone] Patients need to be referred by their primary care doctor.   Medication Assistance: Organization         Address  Phone   Notes  [facility] Medication [facility] [address] [place], [place] [address] [phone] --Must be a resident of [facility] -- Must have NO insurance coverage whatsoever (no Medicaid/ Medicare, etc.) -- The pt. MUST have a primary care doctor that directs their care regularly and follows them in the community   MedAssist  [phone]   [employer]  [phone]    Agencies that provide inexpensive medical care: Organization         Address  Phone   Notes  [place]  [phone]   [facility]    [phone]   [facility] [place],  [address] [phone]   [place] [address], [place] [phone]   Planned Parenthood    [phone]  [facility]    [phone]   Community Health and [facility]  [address], [place] Phone:  [phone], Fax:  [phone] Hours of Operation:  9 am - 6 pm, M-F.  Also accepts Medicaid/Medicare and self-pay.  [facility] for [address], [address], [place] Phone: [phone], Fax: [phone]. Hours of Operation:  8:30 am - 5:30 pm, M-F.  Also accepts Medicaid and self-pay.  [facility] [place] [address] [place] Phone: [phone]   [place], [place], [place] [phone], Ext. [phone] Mondays & Thursdays: 7-9 AM.  First 15 patients are seen on a first come, first serve basis.    [place] Providers:  Organization         Address  Phone   Notes  [facility] [address], [place] [phone] Also accepts self-pay patients.  [facility] [address], [place]  [phone]   [place], [address], [place] [phone]   [facility] [address] [place] [phone]   Lucianne Lei [address], [place]   [phone] Only accepts [place] Access [place] patients after they have their name applied to their card.   Self-Pay (no insurance) in [facility]:  Organization         Address  Phone   Notes  Sickle Cell Patients, [facility] [place] [phone]   [facility] [place] [phone]   [facility] [place]  [place], [place], [place] [phone]   [facility]/Dr. Osei-Bonsu  [address] [place] or [address], [place] [phone] Phone number for both [place] and [place] locations is the same.  Urgent Medical and [facility] [address] [place] [phone]   [facility] [address] [place] or [address] [phone]   [facility] [address], [place] [phone], phone; [phone], fax Sees patients 1st and 3rd Saturday of every month.  Must not qualify for public or private insurance (i.e. Medicaid, Medicare, [place] Health Choice, Veterans' Benefits)  Household income should be no more than 200% of the poverty level The clinic cannot treat you if you are pregnant or think you are  pregnant  Sexually transmitted diseases are not treated at the clinic.    Dental Care: Organization         Address  Phone  Notes  [facility] of [facility] [phone] Accepts children up to age 24 who are enrolled in [place] or [place]; pregnant women with a Medicaid card; and children who have applied for Medicaid or [place] Health Choice, but were declined, whose parents can pay a reduced fee at time of service.  [facility] Department of [facility]  [address], [place] [phone] Accepts children up to age 69 who are enrolled in [place] or [place]; pregnant women with a Medicaid card; and children who have applied for Medicaid or [place] Health Choice, but were declined, whose parents can pay a reduced fee at time  of service.  [facility] Adult [facility] Access PROGRAM  [place] [phone] Patients are seen by appointment only. Walk-ins are not accepted. [place] will see patients 2 years of age and older. Monday - Tuesday (8am-5pm) Most Wednesdays (8:30-5pm) $30 per visit, cash only  [facility] Adult Dental Access PROGRAM  [address], [facility] [phone] Patients are seen by appointment only. Walk-ins are not accepted. [place] will see patients 82 years of age and older. One Wednesday Evening (Monthly: Volunteer Based).  $30 per visit, cash only  [place]  [phone] for adults; Children under age 14, call Graduate Pediatric Dentistry at [phone]. Children aged 77-14, please call [phone] to request a pediatric application.  Dental services are provided in all areas of dental care including fillings, crowns and bridges, complete and partial dentures, implants, gum treatment, root canals, and extractions. Preventive care is also provided. Treatment is provided to both adults and  children. Patients are selected via a lottery and there is often a waiting list.   [facility] [address] [place]  [phone] [email or web address]   [facility] [address] [place], [place] [phone], Ext. [phone] Second and Fourth Thursday of each month, opens at 6:30 AM; Clinic ends at 9 AM.  Patients are seen on a first-come first-served basis, and a limited number are seen during each clinic.   [facility]  [address] Hillard Danker [place], [place] [phone]   Eligibility Requirements You must have lived in [place], [place], or [place] counties for at least the last three months.   You cannot be eligible for state or federal sponsored [employer], including [employer], [place], or [employer].   You generally cannot be eligible for healthcare insurance through your employer.    How to apply: Eligibility screenings are held every Tuesday and Wednesday afternoon from 1:00 pm until 4:00 pm. You do not need an appointment for the interview!  [facility] [address] [place], [place]   [place]  [place] Department  [place]  [phone]    Behavioral Health Resources in the Community: Intensive Outpatient Programs Organization         Address  Phone  Notes  [place]. [address] [place], [place] [phone]   [facility] [address] [place], [place]   ADS: Alcohol & Drug Svcs [address], [place], [place]   [place] [address],  [place], [place] or [phone]   Substance Abuse Resources Organization         Address  Phone  Notes  Alcohol and Drug Services  [phone]   [place]  [phone]   The [place]    Chinita Pester  [phone]   Residential & Outpatient Substance Abuse Program  [phone]   Psychological Services Organization         Address  Phone  Notes  [facility] [place]  [place]  [phone]   [place] [address] [place] [phone] or [phone]    Mobile Crisis Teams Organization         Address  Phone  Notes  [facility], Mobile Crisis Care Unit  [phone]   Assertive Psychotherapeutic  Services  [address] [place], [place]   [facility] [address] [place] [phone]    Self-Help/Support Groups Organization         Address  Phone             Notes  [place]. of [place] - variety of support groups  [place] Call for more information  Narcotics Anonymous (NA), [facility] [address], [employer] [place]  2 meetings at this location   Special educational needs teacher         Address  Phone  Notes  [facility] Residential Treatment [place],    [place]  [phone]   [facility]  [address] [place] [phone], [place], [place]   [place], [place] [phone] Admissions: 8am-3pm M-F  Incentives Substance [place] [address]    [place], [place] [phone]   [facility] [address] [place], [place], [place]   [facility] [address]  [place], [place]   [facility] Programs - Intensive Outpatient [address], [place], [place]   [facility] ([place].,  [place], [place] [phone] or [phone]   [facility]idential Treatment Services (RTS) [address], [place], [place] Accepts Medicaid  [facility] [place] [address],  [place] [phone] Substance Abuse/Addiction Treatment   [facility] Organization         Address  Phone  Notes  [facility]  [phone]   Domenic Schwab, PhD [address] Arlis [place], [place]   [phone] or [phone]   [place] [place], [place] [phone]   [facility] [phone], [place], [place] [phone] Insurance/Medicaid/sponsorship through [facility] and Families [address], [address] [place]                                    [place], [place] [phone] [place] [address], [place] [phone]    Dr. Adele Schilder  [phone]   [facility] [employer] Dept. 1) [address], [place] 2) [place] 3)  [place] [address], [place] [phone]  [phone]   [place] [phone] or [phone] (After Hours)

## 2015-08-31 NOTE — ED Notes (Signed)
ETA is about 45 minutes for radiology tech to perform MRI.

## 2015-08-31 NOTE — ED Notes (Signed)
Per pt, states she fell walking the dog-now having right back pain radiating sown right leg

## 2015-08-31 NOTE — ED Notes (Signed)
Patient d/c with family driving.  F/U and medications discussed.  Patient and family verbalized understanding.

## 2015-08-31 NOTE — ED Notes (Signed)
Crackers and ginger ale given for nausea

## 2015-08-31 NOTE — ED Notes (Signed)
Pt states nausea is better after medication.

## 2015-08-31 NOTE — ED Notes (Signed)
Patient ambulatory the bathroom unassisted without difficulty.

## 2015-09-11 ENCOUNTER — Ambulatory Visit (INDEPENDENT_AMBULATORY_CARE_PROVIDER_SITE_OTHER): Payer: PRIVATE HEALTH INSURANCE | Admitting: Licensed Clinical Social Worker

## 2015-09-11 DIAGNOSIS — F411 Generalized anxiety disorder: Secondary | ICD-10-CM

## 2015-09-11 DIAGNOSIS — F332 Major depressive disorder, recurrent severe without psychotic features: Secondary | ICD-10-CM

## 2015-12-02 ENCOUNTER — Ambulatory Visit (INDEPENDENT_AMBULATORY_CARE_PROVIDER_SITE_OTHER): Payer: PRIVATE HEALTH INSURANCE | Admitting: Licensed Clinical Social Worker

## 2015-12-02 DIAGNOSIS — F332 Major depressive disorder, recurrent severe without psychotic features: Secondary | ICD-10-CM

## 2015-12-02 DIAGNOSIS — F411 Generalized anxiety disorder: Secondary | ICD-10-CM | POA: Diagnosis not present

## 2015-12-11 ENCOUNTER — Ambulatory Visit (INDEPENDENT_AMBULATORY_CARE_PROVIDER_SITE_OTHER): Payer: PRIVATE HEALTH INSURANCE | Admitting: Licensed Clinical Social Worker

## 2015-12-11 DIAGNOSIS — F332 Major depressive disorder, recurrent severe without psychotic features: Secondary | ICD-10-CM | POA: Diagnosis not present

## 2015-12-11 DIAGNOSIS — F411 Generalized anxiety disorder: Secondary | ICD-10-CM | POA: Diagnosis not present

## 2015-12-18 ENCOUNTER — Ambulatory Visit: Payer: PRIVATE HEALTH INSURANCE | Admitting: Licensed Clinical Social Worker

## 2015-12-23 ENCOUNTER — Ambulatory Visit (INDEPENDENT_AMBULATORY_CARE_PROVIDER_SITE_OTHER): Payer: PRIVATE HEALTH INSURANCE | Admitting: Licensed Clinical Social Worker

## 2015-12-23 DIAGNOSIS — F322 Major depressive disorder, single episode, severe without psychotic features: Secondary | ICD-10-CM | POA: Diagnosis not present

## 2015-12-23 DIAGNOSIS — F411 Generalized anxiety disorder: Secondary | ICD-10-CM | POA: Diagnosis not present

## 2016-02-11 ENCOUNTER — Encounter (HOSPITAL_COMMUNITY): Payer: Self-pay

## 2016-02-11 ENCOUNTER — Ambulatory Visit (HOSPITAL_COMMUNITY)
Admission: RE | Admit: 2016-02-11 | Discharge: 2016-02-11 | Disposition: A | Discharge status: Home / Self Care | Payer: BLUE CROSS/BLUE SHIELD | Source: Ambulatory Visit | Attending: Obstetrics & Gynecology | Admitting: Obstetrics & Gynecology

## 2016-02-11 DIAGNOSIS — Z3169 Encounter for other general counseling and advice on procreation: Secondary | ICD-10-CM

## 2016-02-11 DIAGNOSIS — M329 Systemic lupus erythematosus, unspecified: Secondary | ICD-10-CM | POA: Diagnosis not present

## 2016-02-11 DIAGNOSIS — Z9884 Bariatric surgery status: Secondary | ICD-10-CM | POA: Diagnosis not present

## 2016-02-11 DIAGNOSIS — F329 Major depressive disorder, single episode, unspecified: Secondary | ICD-10-CM | POA: Diagnosis not present

## 2016-02-11 DIAGNOSIS — IMO0002 Reserved for concepts with insufficient information to code with codable children: Secondary | ICD-10-CM

## 2016-02-11 DIAGNOSIS — Z9882 Breast implant status: Secondary | ICD-10-CM | POA: Diagnosis not present

## 2016-02-11 NOTE — Progress Notes (Signed)
MFM Consult Note  35 year old, G1P0010 for preconception counseling for person history of lupus and gastric bypass and paternal history of von Willebrand  PMHx - Lupus,  PSHx - gastric bypass, breast implants x2, spinal surgery OBHx - TAB x1 without reported complication Allergies celebrex SHx - neg tob/ETOH/illicit drug use FMHx   DM - F, CHTN - F, Colon CA - PGM, Stomach CA - GM  Impression and recommendations #1 Preconception counseling - Currently taking prenatal vitamin daily. Discussed routine preconception issues.  #2 Lupus - she notes that she had active disease most of 2016 and has been working on control with current plaquenil dose of 400 orally daily and supplemental prednisone and motrin to control flares of her joint pain. We talked about optimizing control and getting pregnancy once her lupus has been quiescent for ~6 months.  - we discussed the effects of lupus on pregnancy, pregnancy on lupus and issues with her current medications and likely medications that may be used during pregnancy - baseline labs for pregnancy include C3/C4, 24 urine for total protein along with routine NOB labs - early dating ultrasound, first trimester screening at 10-14 weeks if desired, fetal anatomic survey at ~18-[redacted] weeks gestation, serial monthly ultrasounds for fetal growth starting ~[redacted] weeks gestation, daily maternal assessment of fetal movement starting ~[redacted] weeks gestation with immediate provider contact for decreased or cessation of fetal movement, antenatal testing at ~32-[redacted] weeks gestation, consideration of induction of labor at 92-[redacted] weeks gestation if undelivered. - is does not appear in the lab tests sent for this consult that evaluation for SSA/SSB was done and this should be done preconception. If SSA is positive, monitoring for fetal heart block during pregnancy should be instituted. 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 Currently back to eating normal meal volumes without limitations of dietary choices. Currently at a similar weight to when she had the gastric bypass. Is being following for replacement therapy. The following should be checked preconception with replacement as needed preconception and each trimester of pregnancy (if in the normal range preconception, or more frequent if ongoing replacement). Complete blood count, electrolytes, glucose, iron studies, ferritin, Vitamin B12,  aminotransferases, alkaline phosphatase, bilirubin, albumin, lipid profile, 25-hydroxyvitamin D, parathyroid hormone (PTH), thiamine, folate, zinc, copper. - often with this type of gastric bypass, there can be dumping syndrome with glucola testing in pregnancy. Consider alternatives to this test #4 History of breast implants - implants below and subsequent placement above the muscle. She states that an incision was made around the areola which may affect breastfeeding. Lactation consulation #5 History of spinal surgery (L5) - had minimally invasive surgery in 2013 at L5 level. Consider anesthesia consult in the third trimester as to the possibility of epidural placement options while in labor #6 History of abnormal pap/HPV - managed by primary OB #7 Paternal von Willebrand - had genetic consultation today. See their detailed report #8 History of depression - notes history of depression with diagnosis of Lupus. She had a therapist and medication [place] (she will check on the medications used as she felt they did not work at all). We discussed postpartum blues, depression etc. and methods to help diagnosis and make less likely. - regular depression screening during pregnancy and particularly during the peri/postpartum period.  Questions appear answered to the couples satisfaction. Precautions for the above given. Spent greater than 1/2 of 50 minute visit face to face  counseling.

## 2016-02-11 NOTE — ED Notes (Signed)
Weight 191lb, BP 130/85, Pt in with Dr. Lawerance Cruel for consultation.

## 2016-02-13 ENCOUNTER — Other Ambulatory Visit (HOSPITAL_COMMUNITY): Payer: Self-pay

## 2016-02-13 DIAGNOSIS — IMO0002 Reserved for concepts with insufficient information to code with codable children: Secondary | ICD-10-CM | POA: Insufficient documentation

## 2016-02-13 NOTE — Progress Notes (Signed)
Genetic Counseling  Preconception Note  Appointment Date:  02/11/2016 Referred By: Princess Bruins, MD Date of Birth:  11/13/1980 Partner:  Dian Queen   Pregnancy History: [id number] Attending: Seward Meth, MD    I met with Mrs. Kathleen Ashley and her husband, Mr. Alicea Wente, for genetic counseling because of a family history of von Willebrand disease for Mr. Koran.  In summary:  Discussed family history of von Willebrand disease (VWD)  Mr. Dorion reported history of bleeding disorder for himself, his full sister, and his father  Reported that the condition is possibly von Willebrand but has also been reported to him as a condition that may be similar to VWD  His paternal cousin also possibly has the condition  Discussed that the family history is suggestive of autosomal dominant inheritance, in which case recurrence risk for each of Mr. Fiola offspring could be up to 1 in 2 (50%)  In the case of VWD, reviewed that genetic testing is available  Couple expressed that they would not be interested in genetic testing regarding a pregnancy for the bleeding condition in the family given the age of onset and the available treatments   Recurrence risk estimate may change in the case of a different underlying bleeding condition  Patient has personal history of lupus and maternal uncle with lupus  MFM consult today regarding patient's history; see separate note  Reviewed suspected multifactorial inheritance of autoimmune conditions  Discussed general population carrier screening options (CF, SMA, hemoglobinopathies), as well as expanded carrier screening panels: patient declined today  Briefly reviewed age related risk for fetal aneuploidy in pregnancy and screening options that would be available in a future pregnancy  We began by reviewing the family history in detail. Mr. Izquierdo reported a personal history and family history of a bleeding disorder, described as possibly von Willebrand  disease. He reported that his sister was the first individual to be diagnosed following menorrhagia and hospitalization for anemia. She is currently 34 years old. Mr. Braun reported that he had prolonged bleeding with nosebleeds and gum bleeds. He was also evaluated and found to have the same bleeding condition as his sister. Their father also reportedly has the same condition, with more significant gum bleeding and nosebleeds. Mr. Heffern reported a female paternal cousin (his uncle's daughter) who also possibly has features of a bleeding condition. The paternal uncle is reportedly unaffected. We discussed that this could represent reduced penetrance of the condition or possibly a different bleeding condition for this cousin. Mr. Brunke reported that he was recently evaluated by hematology/oncology through [facility] prior to knee surgery and that his levels were within normal range. There are reportedly conflicting reports on if the bleeding condition in the family is von Willebrand disease or a bleeding condition that presents similarly to VWD.   Given the strong suspicion for VWD, we spent time discussing this condition. We discussed that von Willebrand disease (VWD), is a congenital bleeding disorder caused by deficient or defective plasma von Willebrand factor (VWF).  It may only become apparent on hemostatic challenge, and bleeding history may become more apparent with increasing age.  There are several different types of VWD.  Type 1 is the most common (70%) and typically is inherited as an autosomal dominant condition and manifests as mild bleeding.  Type 1 is due to a partial quantitative deficiency of an essentially normal VWF.   Type 2 affects approximately 25% and has multiple subtypes.  Most of the subtypes manifest as  mild to moderate bleeding, but may also include thrombocytopenia or more severe bleeding.  This type is due to a qualitative defect in VWF and the severity is associated  with the VWF function disturbed by the mutation.  Type 3 is a complete quantitative deficiency of VWF, and accounts for less than 5% of individuals with VWD.  It manifests as severe bleeding and bleeding within joints.     Based on the pattern of affected family members and their reported clinical course, we discussed that the inheritance pattern is most likely autosomal dominant. We cannot confirm the specific bleeding condition without medical records. We reviewed autosomal dominant inheritance.  We discussed that the VWF gene is located on chromosome 12.  VWF is a large multimeric glycoprotein with a pivotal role in primary hemostasis by mediating platelet hemostatic function and stabilizing blood coagulation factor VIII (FVIII). In type 1 VWD, a mutation in one copy of the VWF gene reduces the amount of VWF available for coagulation.  If a person has VWD and has a mutation in one VWF allele, with each pregnancy there is a 50% chance to pass on the VWF allele with the mutation and a 50% chance to pass on the VWF allele without the mutation.  This couple understands therefore, that with each pregnancy, there is a 50% chance to have a child with the bleeding condition in the family and a 50% chance to have a child who does not have the bleeding condition in the family.  We discussed that in VWD, there is some variability of expression even within a family.  The amount of VWF present can vary even within a family with the same mutation; some of this variability is related to blood type.  Therefore, a specific prediction on the clinical severity or percent VWF present cannot be provided.  Without further information regarding the provided family history, an accurate genetic risk cannot be calculated. Further genetic counseling is warranted if more information is obtained.  They were then counseled regarding the option of prenatal diagnosis for VWD.  We discussed that approximately 60-65% of persons with VWD have  a detectable VWF mutation identified on DNA testing.  This means that not all persons with VWD will have an identifiable mutation.  Prior to initiating prenatal testing,  Mr. Pilgrim would need to be tested to determine the mutation present in the family and if that mutation was detectable.   If a mutation was not found, prenatal testing would not be available.  If a mutation was found, then fetal cells could be obtained either by CVS or amniocentesis depending upon the gestation at that time, and DNA could be extracted from those cells and tested for the familial mutation.  They were counseled about the risk of 1 in 300-500 was given for amniocentesis, the primary complication being spontaneous pregnancy loss, and the approximate 1 in 694 risk for complication with CVS. We discussed that in the case of a different inherited bleeding disorder, molecular testing would depend upon the specific gene associated with the condition. Mr. and Mrs. Laible indicated that they would not be interested in prenatal diagnosis for the bleeding disorder in the family, given the age of onset and available treatment. Additional information regarding the specific condition in the family may alter recurrence risk assessment.   Mrs. Behlke has a personal history of lupus. She was seen for MFM consultation today regarding this history. See separate note. She also reported a maternal uncle with lupus. We reviewed  that lupus is an autoimmune disease, and gene susceptibility has been reported. There is an increased recurrence risk for relatives, but Mrs. Darwish understands that prenatal screening and testing is not available regarding lupus in a pregnancy.   The family histories were otherwise found to be noncontributory for birth defects, intellectual disability, and known genetic conditions. Without further information regarding the provided family history, an accurate genetic risk cannot be calculated. Further genetic counseling is  warranted if more information is obtained.  We briefly discussed chromosomes, nondisjunction and the associated chance for fetal aneuploidy related to maternal age. We reviewed screening and diagnostic options that would be available in a future pregnancy.  Regarding screening tests, we discussed the options of First screen, Quad screen and ultrasound.  We also reviewed the availability of diagnostic options including CVS and amniocentesis.    Mrs. Deedee Lybarger was provided with written information regarding cystic fibrosis (CF), spinal muscular atrophy (SMA) and hemoglobinopathies including the carrier frequency, availability of carrier screening and prenatal diagnosis if indicated.  In addition, we discussed that CF and hemoglobinopathies are routinely screened for as part of the [place] newborn screening panel.  We also discussed the option of an expanded carrier screening panel to assess carrier status for numerous autosomal recessive and X-linked recessive conditions. We reviewed benefits and limitations of this screening. We discussed the risks, limitations, and benefits of each. We discussed the possible results that the tests might provide including: positive, negative, unanticipated, and no result. Finally, they were counseled regarding the cost of each option and potential out of pocket expenses. After further discussion, she declined screening for CF, SMA, hemoglobinopathies and expanded carrier screening today. She planned to further consider this option.   I counseled this couple regarding the above risks and available options. The approximate face-to-face time with the genetic counselor was 40 minutes.  Chipper Oman, MS Certified Genetic Counselor 02/13/2016

## 2016-03-02 DIAGNOSIS — F4312 Post-traumatic stress disorder, chronic: Secondary | ICD-10-CM | POA: Diagnosis not present

## 2016-09-15 DIAGNOSIS — Z6829 Body mass index (BMI) 29.0-29.9, adult: Secondary | ICD-10-CM | POA: Diagnosis not present

## 2016-09-15 DIAGNOSIS — M545 Low back pain: Secondary | ICD-10-CM | POA: Diagnosis not present

## 2016-09-15 DIAGNOSIS — M5416 Radiculopathy, lumbar region: Secondary | ICD-10-CM | POA: Diagnosis not present

## 2016-09-15 DIAGNOSIS — M5136 Other intervertebral disc degeneration, lumbar region: Secondary | ICD-10-CM | POA: Diagnosis not present

## 2016-09-29 ENCOUNTER — Encounter: Payer: Self-pay | Admitting: Physical Therapy

## 2016-09-29 ENCOUNTER — Ambulatory Visit: Payer: BLUE CROSS/BLUE SHIELD | Attending: Orthopaedic Surgery | Admitting: Physical Therapy

## 2016-09-29 ENCOUNTER — Ambulatory Visit: Payer: BLUE CROSS/BLUE SHIELD | Admitting: Physical Therapy

## 2016-09-29 DIAGNOSIS — M545 Low back pain, unspecified: Secondary | ICD-10-CM

## 2016-09-29 DIAGNOSIS — M62830 Muscle spasm of back: Secondary | ICD-10-CM

## 2016-09-29 NOTE — Therapy (Signed)
[facility] [place], [place], [address] Phone: [phone]   Fax:  [phone]  Physical Therapy Evaluation  Patient Details  Name: Kathleen Ashley MRN: [id number] Date of Birth: 1981/06/02 Referring Provider: Leonarda Salon  Encounter Date: 09/29/2016      PT End of Session - 09/29/16 1631    Visit Number 1   Number of Visits 8   Date for PT Re-Evaluation 10/29/16   PT Start Time 1500   PT Stop Time 1546   PT Time Calculation (min) 46 min   Activity Tolerance Patient tolerated treatment well   Behavior During Therapy Anxious      Past Medical History:  Diagnosis Date  . DDD (degenerative disc disease)   . Lupus     Past Surgical History:  Procedure Laterality Date  . BACK SURGERY    . BREAST SURGERY    . GASTRIC BYPASS      There were no vitals filed for this visit.       Subjective Assessment - 09/29/16 1511    Subjective Patient reports that she started having back pain about two months ago, she has a history of back pain with a discectomy in 2014.  She reports that she has been inactive and was on prednisone for Lupus and has gained 40# in the past year.   Limitations Sitting;Standing;Walking;Lifting   How long can you sit comfortably? 20   How long can you stand comfortably? 60   How long can you walk comfortably? 45   Diagnostic tests x-rays show DDD, had a sicectomy in 2014   Patient Stated Goals have less pain    Currently in Pain? Yes   Pain Score 5    Pain Location Back   Pain Orientation Lower   Pain Descriptors / Indicators Aching   Pain Type Acute pain   Pain Onset More than a month ago   Pain Frequency Constant   Aggravating Factors  sitting, bending, lifting pain up to 10/10   Pain Relieving Factors new bed with "zero gravity setting" also demonstrates her holding herself up with her arms to decrease pressure will decrease pain to 2/10   Effect of Pain on Daily Activities  limits all ADL's            [facility] PT Assessment - 09/29/16 0001      Assessment   Medical Diagnosis LBP   Referring Provider T[place]ba   Onset Date/Surgical Date 08/29/16   Prior Therapy no     Precautions   Precautions None     Balance Screen   Has the patient fallen in the past 6 months No   Has the patient had a decrease in activity level because of a fear of falling?  No   Is the patient reluctant to leave their home because of a fear of falling?  No     Home Environment   Additional Comments does housework     Prior Function   Level of Independence Independent   Vocation Part time employment   Vocation Requirements sitting, walking   Leisure walks for exercise     Sensation   Additional Comments reports some decreased sensation in the right lateral foot and shin from the surgery in 2014     Posture/Postural Control   Posture Comments gaurded posture, fwd head, rounded shoulders     ROM / Strength   AROM / PROM / Strength AROM;Strength     AROM  Overall AROM Comments Lumbar ROM was decreased 50% for all motions with the worst pain being with flexion     Strength   Overall Strength Comments LE strength 4-/5 due to pain in the low back     Palpation   Palpation comment lumbar paraspinals very tight, she is tender bilaterally at L4 to the buttocks     Special Tests    Special Tests --  manual traction decreased some pain     Ambulation/Gait   Gait Comments mild antalgic gait on the right                   [facility] Adult PT Treatment/Exercise - 09/29/16 0001      Modalities   Modalities Traction     Traction   Type of Traction Lumbar   Max (lbs) 60   Hold Time static   Time 12 minutes                PT Education - 09/29/16 1631    Education provided Yes   Education Details spoke with patient about foot prop for brushing teeth to alleviate some pain   Person(s) Educated Patient   Methods Explanation;Demonstration    Comprehension Verbalized understanding          PT Short Term Goals - 09/29/16 1636      PT SHORT TERM GOAL #1   Title understand proper posture and body mechanics   Time 2   Period Weeks   Status New           PT Long Term Goals - 09/29/16 1638      PT LONG TERM GOAL #1   Title independent iwth HEP   Time 8   Period Weeks   Status New     PT LONG TERM GOAL #2   Title increase lumbar ROM to WNL's   Time 8   Period Weeks   Status New     PT LONG TERM GOAL #3   Title decrease pain 50%   Time 8   Period Weeks   Status New     PT LONG TERM GOAL #4   Title tolerate sitting 30 minutes without increase of pain   Time 8   Period Weeks   Status New               Plan - 09/29/16 1632    Clinical Impression Statement Patient reports that she has been having significant LBP for about 2 months.  She reports that she had lumbar surgery in 2014 for a pinched nerve due to a bulging disc.  She reports that a self traction type position decreases her pain.  She was anxious, has a secondary diagnosis of Lupus.  Reports pain with bending.  Some tightness int he HS and piriformis mms..  Weak core mms.   Rehab Potential Good   PT Frequency 2x / week   PT Duration 6 weeks   PT Next Visit Plan try traction, start exercises for lumbar stabilization and start correct body mechanics for ADL's   Consulted and Agree with Plan of Care Patient      Patient will benefit from skilled therapeutic intervention in order to improve the following deficits and impairments:  Decreased activity tolerance, Decreased range of motion, Decreased strength, Difficulty walking, Increased muscle spasms, Impaired flexibility, Impaired sensation, Improper body mechanics, Pain  Visit Diagnosis: Acute bilateral low back pain without sciatica - Plan: PT plan of care cert/re-cert  Muscle spasm  of back - Plan: PT plan of care cert/re-cert     Problem List Patient Active Problem List   Diagnosis  Date Noted  . Encounter for genetic counseling 02/13/2016    Sumner Boast., PT 09/29/2016, 4:41 PM  [place], [place], [address] Phone: [phone]   Fax:  [phone]  Name: Kathleen Ashley MRN: [id number] Date of Birth: [birth year]

## 2016-10-01 ENCOUNTER — Ambulatory Visit: Payer: BLUE CROSS/BLUE SHIELD | Admitting: Physical Therapy

## 2016-10-05 ENCOUNTER — Ambulatory Visit: Payer: BLUE CROSS/BLUE SHIELD | Admitting: Physical Therapy

## 2016-10-05 ENCOUNTER — Encounter: Payer: Self-pay | Admitting: Physical Therapy

## 2016-10-05 DIAGNOSIS — M545 Low back pain, unspecified: Secondary | ICD-10-CM

## 2016-10-05 DIAGNOSIS — M62830 Muscle spasm of back: Secondary | ICD-10-CM

## 2016-10-05 NOTE — Therapy (Addendum)
[facility] [place], [place], [address] Phone: [phone]   Fax:  [phone]  Physical Therapy Treatment  Patient Details  Name: Kathleen Ashley MRN: [id number] Date of Birth: [birth year] Referring Provider: Leonarda Salon  Encounter Date: 10/05/2016      PT End of Session - 10/05/16 1423    Visit Number 2   Number of Visits 8   Date for PT Re-Evaluation 10/29/16   PT Start Time 1829   PT Stop Time 1439   PT Time Calculation (min) 54 min   Activity Tolerance Patient tolerated treatment well      Past Medical History:  Diagnosis Date  . DDD (degenerative disc disease)   . Lupus     Past Surgical History:  Procedure Laterality Date  . BACK SURGERY    . BREAST SURGERY    . GASTRIC BYPASS      There were no vitals filed for this visit.      Subjective Assessment - 10/05/16 1346    Subjective Okay today not that much pain. Patient stated traction really helped her for the two days after her last treatment.   Currently in Pain? Yes   Pain Score 3    Pain Location Back   Pain Orientation Lower                         [facility] Adult PT Treatment/Exercise - 10/05/16 0001      Exercises   Exercises Lumbar     Lumbar Exercises: Stretches   Passive Hamstring Stretch 2 reps;30 seconds   Lower Trunk Rotation 5 reps;10 seconds     Lumbar Exercises: Aerobic   Stationary Bike Nustep lvl 4 6 mins      Lumbar Exercises: Machines for Strengthening   Cybex Knee Extension 20# 2x10   Cybex Knee Flexion 20# 2x10   Other Lumbar Machine Exercise Lats&Rows 20# 2x10    Other Lumbar Machine Exercise Back extension black tband 2x10     Lumbar Exercises: Seated   Other Seated Lumbar Exercises hip abd/ER 2x10 red tband      Lumbar Exercises: Supine   Bridge Compliant;20 reps;10 reps;2 seconds  reg, add, abd      Modalities   Modalities Traction     Traction   Type of Traction Lumbar   Max  (lbs) 60   Hold Time static   Time 15 minutes                   PT Short Term Goals - 09/29/16 1636      PT SHORT TERM GOAL #1   Title understand proper posture and body mechanics   Time 2   Period Weeks   Status New           PT Long Term Goals - 09/29/16 1638      PT LONG TERM GOAL #1   Title independent iwth HEP   Time 8   Period Weeks   Status New     PT LONG TERM GOAL #2   Title increase lumbar ROM to WNL's   Time 8   Period Weeks   Status New     PT LONG TERM GOAL #3   Title decrease pain 50%   Time 8   Period Weeks   Status New     PT LONG TERM GOAL #4   Title tolerate sitting 30 minutes without increase of pain  Time 8   Period Weeks   Status New               Plan - 10/05/16 1424    Clinical Impression Statement Patient tolerated treatment well and had no increased pain with any exercise. Patient had pain in hooklying but when she bridge she had no pain. Patient was very eager in learning the reason for each exercise. Patient needed mod. VC on correct posture and min. VC on proper technique.   Rehab Potential Good   PT Frequency 2x / week   PT Duration 6 weeks   PT Next Visit Plan try traction, start exercises for lumbar stabilization and start correct body mechanics for ADL's      Patient will benefit from skilled therapeutic intervention in order to improve the following deficits and impairments:  Decreased activity tolerance, Decreased range of motion, Decreased strength, Difficulty walking, Increased muscle spasms, Impaired flexibility, Impaired sensation, Improper body mechanics, Pain  Visit Diagnosis: Acute bilateral low back pain without sciatica  Muscle spasm of back     Problem List Patient Active Problem List   Diagnosis Date Noted  . Encounter for genetic counseling 02/13/2016  PHYSICAL THERAPY DISCHARGE SUMMARY   Plan: Patient agrees to discharge.  Patient goals were not met. Patient is being discharged  due to the patient's request.  ?????     Pt called 10/08/16 4 pm and request D/C as she is transfering to a PT clinic closer to home. [facility] [place], [place] 10/05/2016, 2:27 PM  [place], [place], [address] Phone: [phone]   Fax:  [phone]  Name: Kathleen Ashley MRN: [id number] Date of Birth: 1981/06/24

## 2016-10-06 DIAGNOSIS — M545 Low back pain: Secondary | ICD-10-CM | POA: Diagnosis not present

## 2016-10-06 DIAGNOSIS — M5416 Radiculopathy, lumbar region: Secondary | ICD-10-CM | POA: Diagnosis not present

## 2016-10-06 DIAGNOSIS — M5136 Other intervertebral disc degeneration, lumbar region: Secondary | ICD-10-CM | POA: Diagnosis not present

## 2016-10-07 ENCOUNTER — Ambulatory Visit: Payer: BLUE CROSS/BLUE SHIELD | Admitting: Physical Therapy

## 2016-10-07 DIAGNOSIS — L932 Other local lupus erythematosus: Secondary | ICD-10-CM | POA: Diagnosis not present

## 2016-10-07 DIAGNOSIS — M545 Low back pain: Secondary | ICD-10-CM | POA: Diagnosis not present

## 2016-10-08 ENCOUNTER — Ambulatory Visit: Payer: BLUE CROSS/BLUE SHIELD | Admitting: Physical Therapy

## 2016-10-08 ENCOUNTER — Telehealth: Payer: Self-pay | Admitting: Physical Therapy

## 2016-10-08 NOTE — Telephone Encounter (Signed)
10/08/16 pt called back to say that she is going to PT closer to her home, wants [place] to d/c her

## 2016-10-13 ENCOUNTER — Ambulatory Visit: Payer: BLUE CROSS/BLUE SHIELD | Admitting: Physical Therapy

## 2016-10-15 ENCOUNTER — Ambulatory Visit: Payer: BLUE CROSS/BLUE SHIELD | Admitting: Physical Therapy

## 2016-10-20 DIAGNOSIS — L659 Nonscarring hair loss, unspecified: Secondary | ICD-10-CM | POA: Insufficient documentation

## 2016-10-20 DIAGNOSIS — Z79899 Other long term (current) drug therapy: Secondary | ICD-10-CM | POA: Insufficient documentation

## 2016-10-20 DIAGNOSIS — M329 Systemic lupus erythematosus, unspecified: Secondary | ICD-10-CM | POA: Insufficient documentation

## 2016-10-20 DIAGNOSIS — E559 Vitamin D deficiency, unspecified: Secondary | ICD-10-CM | POA: Insufficient documentation

## 2016-10-20 DIAGNOSIS — R5383 Other fatigue: Secondary | ICD-10-CM | POA: Insufficient documentation

## 2016-10-20 DIAGNOSIS — M47816 Spondylosis without myelopathy or radiculopathy, lumbar region: Secondary | ICD-10-CM | POA: Insufficient documentation

## 2016-10-20 DIAGNOSIS — Z9884 Bariatric surgery status: Secondary | ICD-10-CM | POA: Insufficient documentation

## 2016-10-20 NOTE — Progress Notes (Deleted)
Office Visit Note  Patient: Kathleen Ashley             Date of Birth: 16-Sep-1980           MRN: [id number]             PCP: No primary care provider on file. Referring: No ref. provider found Visit Date: 10/29/2016 Occupation: @GUAROCC @    Subjective:  No chief complaint on file.   History of Present Illness: Kathleen Ashley is a 36 y.o. female ***   Activities of Daily Living:  Patient reports morning stiffness for *** {minute/hour:19697}.   Patient {ACTIONS;DENIES/REPORTS:21021675::"Denies"} nocturnal pain.  Difficulty dressing/grooming: {ACTIONS;DENIES/REPORTS:21021675::"Denies"} Difficulty climbing stairs: {ACTIONS;DENIES/REPORTS:21021675::"Denies"} Difficulty getting out of chair: {ACTIONS;DENIES/REPORTS:21021675::"Denies"} Difficulty using hands for taps, buttons, cutlery, and/or writing: {ACTIONS;DENIES/REPORTS:21021675::"Denies"}   No Rheumatology ROS completed.   PMFS History:  Patient Active Problem List   Diagnosis Date Noted  . Encounter for genetic counseling 02/13/2016    Past Medical History:  Diagnosis Date  . DDD (degenerative disc disease)   . Lupus     No family history on file. Past Surgical History:  Procedure Laterality Date  . BACK SURGERY    . BREAST SURGERY    . GASTRIC BYPASS     Social History   Social History Narrative  . No narrative on file     Objective: Vital Signs: There were no vitals taken for this visit.   Physical Exam   Musculoskeletal Exam: ***  CDAI Exam: No CDAI exam completed.    Investigation: Findings:  Systemic lupus erythematosus with history of positive ANA and double-strand DNA, beta-2 antibody and anticardiolipin antibody.  She has a history of rash, fatigue, arthralgias, and hair loss, which is improved on Plaquenil.    December 2013 x-ray of lumbar spine was normal, MRI of lumbar spine showed foraminal disk protrusion at L4-5 and disk protrusion at L5-S1.  May 2013 her vitamin D level was 28, her beta-2  IgM was positive, ANA was 1:160 nuclear homogenous pattern, double-strand DNA was positive at 1:280.  Rheumatoid factor, sed rate, compliments, and hep panel were negative.  We obtained x-ray of bilateral hands baseline today, AP and oblique views, which were in normal limits without any joint space narrowing or erosive changes.  February 2014:  CBC showed hemoglobin of 11.1.  Comprehensive metabolic panel was normal.  CK normal.  Sed rate was 10.  TSH was normal.  TB Gold was negative.  Rheumatoid factor and CCP were negative.  Her ANA was 1:640 nuclear homogenous pattern.  Double-stranded DNA was 129.  The rest of the ENA was unremarkable.  Her lupus anticoagulant was negative.  Anticardiolipin, IgM, and IgA both were positive and significant titers, and beta-2 IgG, IgM, and IgA all were positive and significant titers.  April 2015:  Her beta-2 IgM and IgA were positive.  Anticardiolipin IgM and IgA were positive.  Her sed rate is normal.  CBC and comprehensive metabolic panel were normal.  Hemoglobin was slightly low at 11.2.  ANA was 1:116 nuclear homogenous pattern, which is improved from her last visit values.  C3 was low at 85.  C4 was normal.  Double-strand DNA was positive but titer was better.  The rest of the ENA was negative.  UA was negative.  Vitamin D was 60.        Imaging: No results found.  Speciality Comments: No specialty comments available.    Procedures:  No procedures performed Allergies: Celebrex [celecoxib]   Assessment /  Plan:     Visit Diagnoses: Other systemic lupus erythematosus with other organ involvement ([place])  High risk medication use - Plaquenil     Orders: No orders of the defined types were placed in this encounter.  No orders of the defined types were placed in this encounter.   Face-to-face time spent with patient was *** minutes. 50% of time was spent in counseling and coordination of care.  Follow-Up Instructions: No Follow-up on  file.   Kathleen Ashley, RT  Note - This record has been created using [employer].  Chart creation errors have been sought, but may not always  have been located. Such creation errors do not reflect on  the standard of medical care.

## 2016-10-22 DIAGNOSIS — M545 Low back pain: Secondary | ICD-10-CM | POA: Diagnosis not present

## 2016-10-22 DIAGNOSIS — L932 Other local lupus erythematosus: Secondary | ICD-10-CM | POA: Diagnosis not present

## 2016-10-29 ENCOUNTER — Ambulatory Visit: Payer: Self-pay | Admitting: Rheumatology

## 2016-11-04 DIAGNOSIS — M545 Low back pain: Secondary | ICD-10-CM | POA: Diagnosis not present

## 2016-11-04 DIAGNOSIS — L932 Other local lupus erythematosus: Secondary | ICD-10-CM | POA: Diagnosis not present

## 2016-11-05 DIAGNOSIS — M545 Low back pain: Secondary | ICD-10-CM | POA: Diagnosis not present

## 2016-11-05 DIAGNOSIS — L932 Other local lupus erythematosus: Secondary | ICD-10-CM | POA: Diagnosis not present

## 2016-11-05 NOTE — Progress Notes (Deleted)
Office Visit Note  Patient: Kathleen Ashley             Date of Birth: 21-Dec-1980           MRN: [id number]             PCP: No primary care provider on file. Referring: No ref. provider found Visit Date: 11/12/2016 Occupation: @GUAROCC @    Subjective:  No chief complaint on file.   History of Present Illness: Kathleen Ashley is a 36 y.o. female with history of systemic lupus or dermatosis. She returns after her last visit in April 2016. Her father is physician in [place].  Activities of Daily Living:  Patient reports morning stiffness for *** {minute/hour:19697}.   Patient {ACTIONS;DENIES/REPORTS:21021675::"Denies"} nocturnal pain.  Difficulty dressing/grooming: {ACTIONS;DENIES/REPORTS:21021675::"Denies"} Difficulty climbing stairs: {ACTIONS;DENIES/REPORTS:21021675::"Denies"} Difficulty getting out of chair: {ACTIONS;DENIES/REPORTS:21021675::"Denies"} Difficulty using hands for taps, buttons, cutlery, and/or writing: {ACTIONS;DENIES/REPORTS:21021675::"Denies"}   No Rheumatology ROS completed.   PMFS History:  Patient Active Problem List   Diagnosis Date Noted  . Systemic lupus erythematosus ([place]) 10/20/2016  . High risk medication use 10/20/2016  . Spondylosis of lumbar region without myelopathy or radiculopathy 10/20/2016  . Other fatigue 10/20/2016  . Alopecia 10/20/2016  . Vitamin D deficiency 10/20/2016  . History of gastric bypass 10/20/2016  . Encounter for genetic counseling 02/13/2016    Past Medical History:  Diagnosis Date  . DDD (degenerative disc disease)   . Lupus     No family history on file. Past Surgical History:  Procedure Laterality Date  . BACK SURGERY    . BREAST SURGERY    . GASTRIC BYPASS     Social History   Social History Narrative  . No narrative on file     Objective: Vital Signs: There were no vitals taken for this visit.   Physical Exam   Musculoskeletal Exam: ***  CDAI Exam: No CDAI exam completed.     Investigation: Findings:  Systemic lupus erythematosus with history of positive ANA and double-strand DNA, beta-2 antibody and anticardiolipin antibody.  She has a history of rash, fatigue, arthralgias, and hair loss, which is improved on Plaquenil.    December 2013 x-ray of lumbar spine was normal, MRI of lumbar spine showed foraminal disk protrusion at L4-5 and disk protrusion at L5-S1.  May 2013 her vitamin D level was 28, her beta-2 IgM was positive, ANA was 1:160 nuclear homogenous pattern, double-strand DNA was positive at 1:280.  Rheumatoid factor, sed rate, compliments, and hep panel were negative.  We obtained x-ray of bilateral hands baseline today, AP and oblique views, which were in normal limits without any joint space narrowing or erosive changes.  February 2014:  CBC showed hemoglobin of 11.1.  Comprehensive metabolic panel was normal.  CK normal.  Sed rate was 10.  TSH was normal.  TB Gold was negative.  Rheumatoid factor and CCP were negative.  Her ANA was 1:640 nuclear homogenous pattern.  Double-stranded DNA was 129.  The rest of the ENA was unremarkable.  Her lupus anticoagulant was negative.  Anticardiolipin, IgM, and IgA both were positive and significant titers, and beta-2 IgG, IgM, and IgA all were positive and significant titers.  April 2015:  Her beta-2 IgM and IgA were positive.  Anticardiolipin IgM and IgA were positive.  Her sed rate is normal.  CBC and comprehensive metabolic panel were normal.  Hemoglobin was slightly low at 11.2.  ANA was 1:116 nuclear homogenous pattern, which is improved from her last visit values.  C3  was low at 85.  C4 was normal.  Double-strand DNA was positive but titer was better.  The rest of the ENA was negative.  UA was negative.  Vitamin D was 60.       Imaging: No results found.  Speciality Comments: No specialty comments available.    Procedures:  No procedures performed Allergies: Celebrex [celecoxib]   Assessment / Plan:      Visit Diagnoses: Other systemic lupus erythematosus with other organ involvement (HCC)  Vitamin D deficiency  Other fatigue  History of gastric bypass  High risk medication use  Spondylosis of lumbar region without myelopathy or radiculopathy  Alopecia    Orders: No orders of the defined types were placed in this encounter.  No orders of the defined types were placed in this encounter.   Face-to-face time spent with patient was *** minutes. 50% of time was spent in counseling and coordination of care.  Follow-Up Instructions: No Follow-up on file.   Bo Merino, MD  Note - This record has been created using Editor, commissioning.  Chart creation errors have been sought, but may not always  have been located. Such creation errors do not reflect on  the standard of medical care.

## 2016-11-12 ENCOUNTER — Ambulatory Visit: Payer: Self-pay | Admitting: Rheumatology

## 2016-11-12 DIAGNOSIS — L932 Other local lupus erythematosus: Secondary | ICD-10-CM | POA: Diagnosis not present

## 2016-11-12 DIAGNOSIS — M545 Low back pain: Secondary | ICD-10-CM | POA: Diagnosis not present

## 2016-11-26 ENCOUNTER — Telehealth: Payer: Self-pay | Admitting: Radiology

## 2016-11-26 ENCOUNTER — Telehealth: Payer: Self-pay | Admitting: *Deleted

## 2016-11-26 NOTE — Telephone Encounter (Signed)
Dr. Estanislado Pandy spoke with Dr. Lake Bells regarding patient. Advising patient has not been seen in out office in several years and has no showed many appointments. Dr. Lake Bells states patient has been on Plaquenil up until 6 months ago and is dependent upon opioids. Dr. Estanislado Pandy advised Dr. Lake Bells we have not been prescribing the Plaquenil and the patient may have been seeing another rheumatologist.

## 2016-11-27 ENCOUNTER — Telehealth (INDEPENDENT_AMBULATORY_CARE_PROVIDER_SITE_OTHER): Payer: Self-pay | Admitting: Rheumatology

## 2016-11-27 NOTE — Telephone Encounter (Signed)
DISCHARGE LETTER MAILED CERTIFIED TODAY

## 2016-11-27 NOTE — Telephone Encounter (Signed)
Dr Estanislado Pandy has had me mail patient a dismissal letter for failure to keep appointments. Tammy will sent the letter certified mail.

## 2016-12-25 DIAGNOSIS — L932 Other local lupus erythematosus: Secondary | ICD-10-CM | POA: Diagnosis not present

## 2016-12-25 DIAGNOSIS — M545 Low back pain: Secondary | ICD-10-CM | POA: Diagnosis not present

## 2016-12-28 DIAGNOSIS — L932 Other local lupus erythematosus: Secondary | ICD-10-CM | POA: Diagnosis not present

## 2016-12-28 DIAGNOSIS — M545 Low back pain: Secondary | ICD-10-CM | POA: Diagnosis not present

## 2016-12-30 DIAGNOSIS — M545 Low back pain: Secondary | ICD-10-CM | POA: Diagnosis not present

## 2016-12-30 DIAGNOSIS — L932 Other local lupus erythematosus: Secondary | ICD-10-CM | POA: Diagnosis not present

## 2017-01-11 DIAGNOSIS — L932 Other local lupus erythematosus: Secondary | ICD-10-CM | POA: Diagnosis not present

## 2017-01-11 DIAGNOSIS — M545 Low back pain: Secondary | ICD-10-CM | POA: Diagnosis not present

## 2017-01-13 DIAGNOSIS — M545 Low back pain: Secondary | ICD-10-CM | POA: Diagnosis not present

## 2017-01-13 DIAGNOSIS — L932 Other local lupus erythematosus: Secondary | ICD-10-CM | POA: Diagnosis not present

## 2017-02-09 DIAGNOSIS — M329 Systemic lupus erythematosus, unspecified: Secondary | ICD-10-CM | POA: Diagnosis not present

## 2017-02-09 DIAGNOSIS — M2550 Pain in unspecified joint: Secondary | ICD-10-CM | POA: Diagnosis not present

## 2017-02-09 DIAGNOSIS — R21 Rash and other nonspecific skin eruption: Secondary | ICD-10-CM | POA: Diagnosis not present

## 2017-07-20 ENCOUNTER — Telehealth: Payer: Self-pay | Admitting: Internal Medicine

## 2017-07-20 NOTE — Telephone Encounter (Signed)
Copied from [place] [phone]. Topic: General - Other >> Jul 20, 2017  3:21 PM Yvette Rack wrote: Reason for CRM: patient calling wanting to know if Dr Sharlet Salina will take her as a new patient she states that her Grandmother is a patient of Dr Sharlet Salina patient name Fitzsimmons,Sharon she has sent Dr Sharlet Salina a e-mail about Granddaughter   Please call Klyn Kroening at [phone]  Please advise

## 2017-07-21 NOTE — Telephone Encounter (Signed)
Fine

## 2017-07-22 NOTE — Telephone Encounter (Signed)
LVM informing patient of response and to call back to schedule appt

## 2017-07-26 DIAGNOSIS — M5137 Other intervertebral disc degeneration, lumbosacral region: Secondary | ICD-10-CM | POA: Diagnosis not present

## 2017-07-26 DIAGNOSIS — M545 Low back pain: Secondary | ICD-10-CM | POA: Diagnosis not present

## 2017-07-27 ENCOUNTER — Ambulatory Visit
Admission: RE | Admit: 2017-07-27 | Discharge: 2017-07-27 | Disposition: A | Discharge status: Home / Self Care | Payer: BLUE CROSS/BLUE SHIELD | Source: Ambulatory Visit | Attending: Neurological Surgery | Admitting: Neurological Surgery

## 2017-07-27 ENCOUNTER — Other Ambulatory Visit: Payer: Self-pay | Admitting: Neurological Surgery

## 2017-07-27 DIAGNOSIS — M545 Low back pain: Secondary | ICD-10-CM

## 2017-07-27 DIAGNOSIS — M5127 Other intervertebral disc displacement, lumbosacral region: Secondary | ICD-10-CM | POA: Diagnosis not present

## 2017-08-11 DIAGNOSIS — M961 Postlaminectomy syndrome, not elsewhere classified: Secondary | ICD-10-CM | POA: Diagnosis not present

## 2017-08-12 ENCOUNTER — Encounter: Payer: Self-pay | Admitting: Internal Medicine

## 2017-08-12 ENCOUNTER — Ambulatory Visit (INDEPENDENT_AMBULATORY_CARE_PROVIDER_SITE_OTHER): Payer: BLUE CROSS/BLUE SHIELD | Admitting: Internal Medicine

## 2017-08-12 DIAGNOSIS — F331 Major depressive disorder, recurrent, moderate: Secondary | ICD-10-CM

## 2017-08-12 DIAGNOSIS — M3219 Other organ or system involvement in systemic lupus erythematosus: Secondary | ICD-10-CM

## 2017-08-12 DIAGNOSIS — M47816 Spondylosis without myelopathy or radiculopathy, lumbar region: Secondary | ICD-10-CM

## 2017-08-12 MED ORDER — DULOXETINE HCL 30 MG PO CPEP: 30.0000 mg | ORAL_CAPSULE | Freq: Every day | ORAL | 3 refills | Status: DC

## 2017-08-12 NOTE — Patient Instructions (Signed)
We have sent in cymbalta for you to start taking. Take 1 pill daily. This can start working in a couple weeks but can take 4-6 weeks to hit maximum effect.   Come back in about 1-2 months or call us to let us know how you are doing. We may need to change you from the low dose to the regular dose of the cymbalta.   Still do the therapy as they can help with coping skills to help you adapt to the changing health.

## 2017-08-12 NOTE — Progress Notes (Signed)
   Subjective:    Patient ID: Kathleen Ashley, female    DOB: [birth year], 36 y.o.   MRN: [id number]  HPI The patient is a 36 YO female coming in new for depression. She was first diagnosed with this in her teens and started on wellbutrin which gave her some SI and she did attempt suicide. She then stopped medication and just used other methods to help. She did well for some years. Then she was diagnosed with lupus about 5 years ago or so and this has caused worsening of her mood. She is never sure what she will be able to do any day. She is on plaquenil and prednisone for flares. Has had a very bad year this year with flares. She has also injured her back and is needing back surgery in the next several weeks for a slipped disk. She is seeing neurosurgery and they will do this early January. She is trying to be active as this helps her mental health but has been struggling with this a lot this year. She denies current SI/HI. She is not active. No joy in life. She is not able to do much physically. Does have some component of anxiety as well.  PMH, [facility], social history reviewed and updated.   Review of Systems  Constitutional: Negative.   HENT: Negative.   Eyes: Negative.   Respiratory: Negative for cough, chest tightness and shortness of breath.   Cardiovascular: Negative for chest pain, palpitations and leg swelling.  Gastrointestinal: Negative for abdominal distention, abdominal pain, constipation, diarrhea, nausea and vomiting.  Musculoskeletal: Positive for arthralgias, back pain, joint swelling and myalgias.  Skin: Negative.   Neurological: Negative.   Psychiatric/Behavioral: Positive for decreased concentration, dysphoric mood and sleep disturbance. Negative for confusion, self-injury and suicidal ideas. The patient is nervous/anxious.       Objective:   Physical Exam  Constitutional: She is oriented to person, place, and time. She appears well-developed and well-nourished.  HENT:  Head:  Normocephalic and atraumatic.  Eyes: EOM are normal.  Neck: Normal range of motion.  Cardiovascular: Normal rate and regular rhythm.  Pulmonary/Chest: Effort normal and breath sounds normal. No respiratory distress. She has no wheezes. She has no rales.  Abdominal: Soft. Bowel sounds are normal. She exhibits no distension. There is no tenderness. There is no rebound.  Musculoskeletal: She exhibits tenderness. She exhibits no edema.  Pain in low back  Neurological: She is alert and oriented to person, place, and time. Coordination normal.  Skin: Skin is warm and dry.  Psychiatric: Her behavior is normal.  Some appropriate tearfulness during the visit   Vitals:   08/12/17 1459  BP: 116/80  Pulse: 85  Temp: 97.6 F (36.4 C)  TempSrc: Oral  SpO2: 99%  Weight: 174 lb (78.9 kg)  Height: 5\' 5"  (1.651 m)      Assessment & Plan:

## 2017-08-13 DIAGNOSIS — F339 Major depressive disorder, recurrent, unspecified: Secondary | ICD-10-CM | POA: Insufficient documentation

## 2017-08-13 NOTE — Assessment & Plan Note (Signed)
Seeing rheumatology and they are prescribing prednisone and plaquenil. She is seeing eye specialist q 6 months for eye monitoring. No flare today.

## 2017-08-13 NOTE — Assessment & Plan Note (Signed)
Rx for cymbalta today, she is about to start therapy which may help some as well. She is struggling with her health to find out what she can do. She will come back in 1-2 month and can dose adjust as well.

## 2017-08-13 NOTE — Assessment & Plan Note (Signed)
Has a slipped disc and getting surgery in the next 2 weeks with neurosurgery. She is taking otc pain medication however some oxycodone in the past and suboxone when  narcotic database reviewed which she is done taking now.

## 2017-08-30 ENCOUNTER — Telehealth: Payer: Self-pay | Admitting: Internal Medicine

## 2017-08-30 NOTE — Telephone Encounter (Signed)
Patient states that she is not sure exactly how it has been helping because her 13 day period has been making her worried, states she doesn't feel bad just worried and will wait to see what her gyn says. States that the only thing is that she gets nauseous at night when she takes it but it goes away. Other than that patient did not have any other complaints

## 2017-08-30 NOTE — Telephone Encounter (Signed)
I do not think this is related. Would advise to see what the gyn says about this. Is this helping her mood?

## 2017-08-30 NOTE — Telephone Encounter (Signed)
Pt. Called to report ever since she started her Cymbalta ,she has been on her menstrual cycle. Wants to know if this is related. States she looked it up and it is listed as a side effect . Has an appointment with her GYN. Next Friday.

## 2017-08-31 ENCOUNTER — Other Ambulatory Visit: Payer: Self-pay | Admitting: Neurological Surgery

## 2017-09-01 ENCOUNTER — Telehealth: Payer: Self-pay | Admitting: Vascular Surgery

## 2017-09-01 ENCOUNTER — Other Ambulatory Visit: Payer: Self-pay | Admitting: *Deleted

## 2017-09-01 NOTE — Telephone Encounter (Signed)
Sched appt 10/05/17 at 8:30. Lm on hm#.

## 2017-09-01 NOTE — Telephone Encounter (Signed)
-----   Message from Willy Eddy, RN sent at 09/01/2017  8:58 AM EST ----- Regarding: Need appointments for the following.  Please make an appointment and call patient: Kathleen Ashley with Dr. Donnetta Hutching ALIF surgery date is 10/08/17.

## 2017-09-03 DIAGNOSIS — Z6828 Body mass index (BMI) 28.0-28.9, adult: Secondary | ICD-10-CM | POA: Diagnosis not present

## 2017-09-03 DIAGNOSIS — Z01419 Encounter for gynecological examination (general) (routine) without abnormal findings: Secondary | ICD-10-CM | POA: Diagnosis not present

## 2017-09-03 DIAGNOSIS — N920 Excessive and frequent menstruation with regular cycle: Secondary | ICD-10-CM | POA: Diagnosis not present

## 2017-09-03 LAB — HM PAP SMEAR

## 2017-09-14 ENCOUNTER — Ambulatory Visit: Payer: Self-pay | Admitting: Internal Medicine

## 2017-09-23 NOTE — Pre-Procedure Instructions (Signed)
Kathleen Ashley  09/23/2017      [facility] [address] AT [facility] [address] [place] [address] Phone: [phone] Fax: [phone]    Your procedure is scheduled on Fri, Feb. 15  Report to [facility] Admitting at 5:30 A.M.  Call this number if you have problems the morning of surgery:  [phone]   Remember:  Do not eat food or drink liquids after midnight on Thurs. Feb. 14   Take these medicines the morning of surgery with A SIP OF WATER :oxycodone if needed,prednisone (deltasone)                 7 days prior to surgery STOP taking any Aspirin(unless otherwise instructed by your surgeon), Aleve, Naproxen, Ibuprofen, Motrin, Advil, Goody's, BC's, all herbal medications, fish oil, and all vitamins   Do not wear jewelry, make-up or nail polish.  Do not wear lotions, powders, or perfumes, or deodorant.  Do not shave 48 hours prior to surgery.  Men may shave face and neck.  Do not bring valuables to the hospital.  [facility] is not responsible for any belongings or valuables.  Contacts, dentures or bridgework may not be worn into surgery.  Leave your suitcase in the car.  After surgery it may be brought to your room.  For patients admitted to the hospital, discharge time will be determined by your treatment team.  Patients discharged the day of surgery will not be allowed to drive home.    Special instructions:   [facility]- Preparing For Surgery  Before surgery, you can play an important role. Because skin is not sterile, your skin needs to be as free of germs as possible. You can reduce the number of germs on your skin by washing with CHG (chlorahexidine gluconate) Soap before surgery.  CHG is an antiseptic cleaner which kills germs and bonds with the skin to continue killing germs even after washing.  Please do not use if you have an allergy to CHG or antibacterial soaps. If your skin  becomes reddened/irritated stop using the CHG.  Do not shave (including legs and underarms) for at least 48 hours prior to first CHG shower. It is OK to shave your face.  Please follow these instructions carefully.   1. Shower the NIGHT BEFORE SURGERY and the MORNING OF SURGERY with CHG.   2. If you chose to wash your hair, wash your hair first as usual with your normal shampoo.  3. After you shampoo, rinse your hair and body thoroughly to remove the shampoo.  4. Use CHG as you would any other liquid soap. You can apply CHG directly to the skin and wash gently with a scrungie or a clean washcloth.   5. Apply the CHG Soap to your body ONLY FROM THE NECK DOWN.  Do not use on open wounds or open sores. Avoid contact with your eyes, ears, mouth and genitals (private parts). Wash Face and genitals (private parts)  with your normal soap.  6. Wash thoroughly, paying special attention to the area where your surgery will be performed.  7. Thoroughly rinse your body with warm water from the neck down.  8. DO NOT shower/wash with your normal soap after using and rinsing off the CHG Soap.  9. Pat yourself dry with a CLEAN TOWEL.  10. Wear CLEAN PAJAMAS to bed the night before surgery, wear comfortable clothes the morning  of surgery  11. Place CLEAN SHEETS on your bed the night of your first shower and DO NOT SLEEP WITH PETS.    Day of Surgery: Do not apply any deodorants/lotions. Please wear clean clothes to the hospital/surgery center.      Please read over the following fact sheets that you were given. Coughing and Deep Breathing, MRSA Information and Surgical Site Infection Prevention

## 2017-09-24 ENCOUNTER — Encounter (HOSPITAL_COMMUNITY): Payer: Self-pay

## 2017-09-24 ENCOUNTER — Encounter (HOSPITAL_COMMUNITY)
Admission: RE | Admit: 2017-09-24 | Discharge: 2017-09-24 | Disposition: A | Discharge status: Home / Self Care | Payer: BLUE CROSS/BLUE SHIELD | Source: Ambulatory Visit | Attending: Neurological Surgery | Admitting: Neurological Surgery

## 2017-09-24 ENCOUNTER — Other Ambulatory Visit: Payer: Self-pay

## 2017-09-24 ENCOUNTER — Ambulatory Visit (HOSPITAL_COMMUNITY)
Admission: RE | Admit: 2017-09-24 | Discharge: 2017-09-24 | Disposition: A | Discharge status: Home / Self Care | Payer: BLUE CROSS/BLUE SHIELD | Source: Ambulatory Visit | Attending: Neurological Surgery | Admitting: Neurological Surgery

## 2017-09-24 DIAGNOSIS — Z01818 Encounter for other preprocedural examination: Secondary | ICD-10-CM | POA: Insufficient documentation

## 2017-09-24 DIAGNOSIS — Z01810 Encounter for preprocedural cardiovascular examination: Secondary | ICD-10-CM | POA: Diagnosis not present

## 2017-09-24 DIAGNOSIS — M961 Postlaminectomy syndrome, not elsewhere classified: Secondary | ICD-10-CM

## 2017-09-24 DIAGNOSIS — Z01812 Encounter for preprocedural laboratory examination: Secondary | ICD-10-CM | POA: Diagnosis not present

## 2017-09-24 LAB — CBC WITH DIFFERENTIAL/PLATELET
Basophils Absolute: 0 10*3/uL (ref 0.0–0.1)
Basophils Relative: 0 %
Eosinophils Absolute: 0.1 10*3/uL (ref 0.0–0.7)
Eosinophils Relative: 1 %
HEMATOCRIT: 35.7 % — AB (ref 36.0–46.0)
HEMOGLOBIN: 11.5 g/dL — AB (ref 12.0–15.0)
LYMPHS ABS: 3.1 10*3/uL (ref 0.7–4.0)
Lymphocytes Relative: 37 %
MCH: 25.7 pg — AB (ref 26.0–34.0)
MCHC: 32.2 g/dL (ref 30.0–36.0)
MCV: 79.9 fL (ref 78.0–100.0)
MONO ABS: 0.7 10*3/uL (ref 0.1–1.0)
MONOS PCT: 8 %
NEUTROS ABS: 4.6 10*3/uL (ref 1.7–7.7)
NEUTROS PCT: 54 %
Platelets: 375 10*3/uL (ref 150–400)
RBC: 4.47 MIL/uL (ref 3.87–5.11)
RDW: 19 % — AB (ref 11.5–15.5)
WBC: 8.4 10*3/uL (ref 4.0–10.5)

## 2017-09-24 LAB — BASIC METABOLIC PANEL
ANION GAP: 10 (ref 5–15)
BUN: 14 mg/dL (ref 6–20)
CALCIUM: 9 mg/dL (ref 8.9–10.3)
CHLORIDE: 108 mmol/L (ref 101–111)
CO2: 21 mmol/L — AB (ref 22–32)
CREATININE: 0.78 mg/dL (ref 0.44–1.00)
GFR calc non Af Amer: >60 mL/min (ref >60)
GLUCOSE: 80 mg/dL (ref 65–99)
Potassium: 4.1 mmol/L (ref 3.5–5.1)
Sodium: 139 mmol/L (ref 135–145)

## 2017-09-24 LAB — TYPE AND SCREEN
ABO/RH(D): A NEG
Antibody Screen: NEGATIVE

## 2017-09-24 LAB — ABO/RH: ABO/RH(D): A NEG

## 2017-09-24 LAB — PROTIME-INR
INR: 0.98
Prothrombin Time: 12.9 seconds (ref 11.4–15.2)

## 2017-09-24 LAB — SURGICAL PCR SCREEN
MRSA, PCR: NEGATIVE
STAPHYLOCOCCUS AUREUS: NEGATIVE

## 2017-09-24 NOTE — Progress Notes (Signed)
PCP - Pricilla Holm  Chest x-ray - 09/24/2017  EKG - 09/24/2017  Patient denies shortness of breath, fever, cough and chest pain at PAT appointment   Patient verbalized understanding of instructions that were given to them at the PAT appointment. Patient was also instructed that they will need to review over the PAT instructions again at home before surgery.

## 2017-10-05 ENCOUNTER — Ambulatory Visit (INDEPENDENT_AMBULATORY_CARE_PROVIDER_SITE_OTHER): Payer: BLUE CROSS/BLUE SHIELD | Admitting: Vascular Surgery

## 2017-10-05 ENCOUNTER — Encounter: Payer: Self-pay | Admitting: Vascular Surgery

## 2017-10-05 VITALS — BP 127/82 | HR 96 | Resp 18 | Ht 66.0 in | Wt 187.0 lb

## 2017-10-05 DIAGNOSIS — M5137 Other intervertebral disc degeneration, lumbosacral region: Secondary | ICD-10-CM | POA: Diagnosis not present

## 2017-10-05 NOTE — Progress Notes (Signed)
Vascular and Vein Specialist of [facility]  Patient name: Kathleen Ashley MRN: [id number] DOB: Jan 30, 1981 Sex: female  REASON FOR CONSULT: Discussed access for L5-S1 disc surgery  HPI: Kathleen Ashley is a 37 y.o. female, who is today for discussion of my role for anterior exposure for L5-S1 disc surgery with Dr. Sherley Bounds.  She had a prior discectomy in 2014 and had good relief for several years.  She has had recurrent degenerative disc disease with pain in her back and also severe pain which is intolerable in both lower extremities worse on the right than on the.  She is failed conservative treatment and is scheduled for anterior approach for disc fusion on 10/08/2017.  She has had prior abdominal surgery.  She underwent gastric sleeve procedure for bariatric surgery several years ago and had what sounds like a postoperative bowel obstruction.  Also underwent subsequent "tummy tuck" with also tightening of her rectus muscles bilaterally.  Cardiac disease and no history of peripheral vascular occlusive disease  Past Medical History:  Diagnosis Date  . DDD (degenerative disc disease)   . Depression   . Low back pain   . Lupus   . Lupus (systemic lupus erythematosus) (HCC)     Family History  Problem Relation Age of Onset  . Diabetes Sister     SOCIAL HISTORY: Social History   Socioeconomic History  . Marital status: Married    Spouse name: Not on file  . Number of children: Not on file  . Years of education: Not on file  . Highest education level: Not on file  Social Needs  . Financial resource strain: Not on file  . Food insecurity - worry: Not on file  . Food insecurity - inability: Not on file  . Transportation needs - medical: Not on file  . Transportation needs - non-medical: Not on file  Occupational History  . Not on file  Tobacco Use  . Smoking status: Never Smoker  . Smokeless tobacco: Never Used  Substance and Sexual Activity  .  Alcohol use: No  . Drug use: No  . Sexual activity: Yes  Other Topics Concern  . Not on file  Social History Narrative  . Not on file    Allergies  Allergen Reactions  . Celebrex [Celecoxib] Other (See Comments)    Headache    Current Outpatient Medications  Medication Sig Dispense Refill  . cyclobenzaprine (FLEXERIL) 5 MG tablet Take 5 mg by mouth at bedtime.  0  . ibuprofen (ADVIL,MOTRIN) 800 MG tablet Take 1 tablet (800 mg total) by mouth 3 (three) times daily. (Patient taking differently: Take 800 mg by mouth every 8 (eight) hours as needed for headache or moderate pain. ) 21 tablet 0  . Multiple Vitamin (MULTIVITAMIN WITH MINERALS) TABS tablet Take 1 tablet by mouth daily. Reported on 02/11/2016    . Omega-3 Fatty Acids (FISH OIL PO) Take 1 capsule by mouth daily.    Marland Kitchen oxyCODONE-acetaminophen (PERCOCET/ROXICET) 5-325 MG tablet Take 1 tablet by mouth every 6 (six) hours as needed for severe pain.  0  . predniSONE (DELTASONE) 5 MG tablet Take 5 mg by mouth once daily  3  . TURMERIC PO Take 1 capsule by mouth daily. Reported on 02/11/2016    . DULoxetine (CYMBALTA) 30 MG capsule Take 1 capsule (30 mg total) by mouth daily. (Patient not taking: Reported on 09/15/2017) 90 capsule 3   No current facility-administered medications for this visit.     REVIEW OF SYSTEMS:  [  X] denotes positive finding, [ ]  denotes negative finding Cardiac  Comments:  Chest pain or chest pressure:    Shortness of breath upon exertion:    Short of breath when lying flat:    Irregular heart rhythm:        Vascular    Pain in calf, thigh, or hip brought on by ambulation:    Pain in feet at night that wakes you up from your sleep:     Blood clot in your veins:    Leg swelling:         Pulmonary    Oxygen at home:    Productive cough:     Wheezing:         Neurologic    Sudden weakness in arms or legs:     Sudden numbness in arms or legs:     Sudden onset of difficulty speaking or slurred speech:     Temporary loss of vision in one eye:     Problems with dizziness:         Gastrointestinal    Blood in stool:     Vomited blood:         Genitourinary    Burning when urinating:     Blood in urine:        Psychiatric    Major depression:         Hematologic    Bleeding problems:    Problems with blood clotting too easily:        Skin    Rashes or ulcers:        Constitutional    Fever or chills:      PHYSICAL EXAM: Vitals:   10/05/17 0832  BP: 127/82  Pulse: 96  Resp: 18  SpO2: 96%  Weight: 187 lb (84.8 kg)  Height: 5\' 6"  (1.676 m)    GENERAL: The patient is a well-nourished female, in no acute distress. The vital signs are documented above. CARDIOVASCULAR: Carotid arteries without bruits bilaterally.  Plus radial and 2+ popliteal pulses bilaterally. PULMONARY: There is good air exchange  ABDOMEN: Soft and non-tender no abdominal bruits.  She does have a low transverse incision from her tummy tuck and multiple ports for her gastric sleeve. MUSCULOSKELETAL: There are no major deformities or cyanosis. NEUROLOGIC: No focal weakness or paresthesias are detected. SKIN: There are no ulcers or rashes noted. PSYCHIATRIC: The patient has a normal affect.  DATA:  None  MEDICAL ISSUES: I had an extensive discussion with the patient and her husband present regarding my role anterior exposure.  Explained that Dr. Ronnald Ramp is determined back surgery is warranted an anterior approach is the best option.  Have explained mobilization of the rectus muscle which may require some additional work due to her prior tummy tuck and rectus tightening.  I explained that this would not be her indication for anterior approach.  Also explained mobilization of the intraperitoneal contents in the retroperitoneal space.  Explained mobilization of left ureter and arterial and venous structures overlying the spine.  Explained the potential injury to all of these particularly venous injuries.  She  understands this and wished to proceed.  I do not see an prohibitive risk for anterior approach.  We will proceed as scheduled for 10/08/2017   Rosetta Posner, MD [facility] Vascular and Vein Specialists of [facility] Tel [phone] Pager [phone]

## 2017-10-07 NOTE — Anesthesia Preprocedure Evaluation (Addendum)
Anesthesia Evaluation  Patient identified by MRN, date of birth, ID band Patient awake    Reviewed: Allergy & Precautions, NPO status , Patient's Chart, lab work & pertinent test results  Airway Mallampati: I       Dental no notable dental hx. (+) Implants   Pulmonary neg pulmonary ROS,    Pulmonary exam normal        Cardiovascular negative cardio ROS   Rhythm:Regular Rate:Normal     Neuro/Psych negative neurological ROS  negative psych ROS   GI/Hepatic negative GI ROS, Neg liver ROS,   Endo/Other  negative endocrine ROS  Renal/GU negative Renal ROS  negative genitourinary   Musculoskeletal negative musculoskeletal ROS (+) Arthritis , LUPUS   Abdominal   Peds negative pediatric ROS (+)  Hematology negative hematology ROS (+)   Anesthesia Other Findings   Reproductive/Obstetrics negative OB ROS                            Lab Results  Component Value Date   WBC 8.4 09/24/2017   HGB 11.5 (L) 09/24/2017   HCT 35.7 (L) 09/24/2017   MCV 79.9 09/24/2017   PLT 375 09/24/2017   Lab Results  Component Value Date   CREATININE 0.78 09/24/2017   BUN 14 09/24/2017   NA 139 09/24/2017   K 4.1 09/24/2017   CL 108 09/24/2017   CO2 21 (L) 09/24/2017   Lab Results  Component Value Date   PREGTESTUR NEGATIVE 08/31/2015     Anesthesia Physical Anesthesia Plan  ASA: II  Anesthesia Plan: General   Post-op Pain Management:  Regional for Post-op pain   Induction: Intravenous  PONV Risk Score and Plan: 4 or greater and Treatment may vary due to age or medical condition, Ondansetron, Dexamethasone and Scopolamine patch - Pre-op  Airway Management Planned: Oral ETT  Additional Equipment:   Intra-op Plan:   Post-operative Plan:   Informed Consent: I have reviewed the patients History and Physical, chart, labs and discussed the procedure including the risks, benefits and  alternatives for the proposed anesthesia with the patient or authorized representative who has indicated his/her understanding and acceptance.   Dental advisory given  Plan Discussed with: CRNA  Anesthesia Plan Comments:         Anesthesia Quick Evaluation

## 2017-10-08 ENCOUNTER — Other Ambulatory Visit: Payer: Self-pay

## 2017-10-08 ENCOUNTER — Encounter (HOSPITAL_COMMUNITY)
Admission: RE | Disposition: A | Discharge status: Home / Self Care | Payer: Self-pay | Source: Ambulatory Visit | Attending: Neurological Surgery

## 2017-10-08 ENCOUNTER — Inpatient Hospital Stay (HOSPITAL_COMMUNITY): Payer: BLUE CROSS/BLUE SHIELD | Admitting: Critical Care Medicine

## 2017-10-08 ENCOUNTER — Inpatient Hospital Stay (HOSPITAL_COMMUNITY): Payer: BLUE CROSS/BLUE SHIELD

## 2017-10-08 ENCOUNTER — Inpatient Hospital Stay (HOSPITAL_COMMUNITY)
Admission: RE | Admit: 2017-10-08 | Discharge: 2017-10-11 | DRG: 460 | Disposition: A | Discharge status: Home / Self Care | Payer: BLUE CROSS/BLUE SHIELD | Source: Ambulatory Visit | Attending: Neurological Surgery | Admitting: Neurological Surgery

## 2017-10-08 ENCOUNTER — Encounter (HOSPITAL_COMMUNITY): Payer: Self-pay | Admitting: *Deleted

## 2017-10-08 DIAGNOSIS — Z79899 Other long term (current) drug therapy: Secondary | ICD-10-CM | POA: Diagnosis not present

## 2017-10-08 DIAGNOSIS — Z972 Presence of dental prosthetic device (complete) (partial): Secondary | ICD-10-CM | POA: Diagnosis not present

## 2017-10-08 DIAGNOSIS — Z9109 Other allergy status, other than to drugs and biological substances: Secondary | ICD-10-CM | POA: Diagnosis not present

## 2017-10-08 DIAGNOSIS — Z9884 Bariatric surgery status: Secondary | ICD-10-CM

## 2017-10-08 DIAGNOSIS — M5127 Other intervertebral disc displacement, lumbosacral region: Secondary | ICD-10-CM | POA: Diagnosis present

## 2017-10-08 DIAGNOSIS — M4716 Other spondylosis with myelopathy, lumbar region: Secondary | ICD-10-CM | POA: Diagnosis not present

## 2017-10-08 DIAGNOSIS — M329 Systemic lupus erythematosus, unspecified: Secondary | ICD-10-CM | POA: Diagnosis present

## 2017-10-08 DIAGNOSIS — M5137 Other intervertebral disc degeneration, lumbosacral region: Secondary | ICD-10-CM | POA: Diagnosis not present

## 2017-10-08 DIAGNOSIS — Z981 Arthrodesis status: Secondary | ICD-10-CM

## 2017-10-08 DIAGNOSIS — Z7952 Long term (current) use of systemic steroids: Secondary | ICD-10-CM | POA: Diagnosis not present

## 2017-10-08 DIAGNOSIS — J45909 Unspecified asthma, uncomplicated: Secondary | ICD-10-CM | POA: Diagnosis not present

## 2017-10-08 DIAGNOSIS — M961 Postlaminectomy syndrome, not elsewhere classified: Secondary | ICD-10-CM | POA: Diagnosis present

## 2017-10-08 DIAGNOSIS — M4326 Fusion of spine, lumbar region: Secondary | ICD-10-CM | POA: Diagnosis not present

## 2017-10-08 DIAGNOSIS — R269 Unspecified abnormalities of gait and mobility: Secondary | ICD-10-CM | POA: Diagnosis not present

## 2017-10-08 DIAGNOSIS — Z419 Encounter for procedure for purposes other than remedying health state, unspecified: Secondary | ICD-10-CM

## 2017-10-08 DIAGNOSIS — M4726 Other spondylosis with radiculopathy, lumbar region: Secondary | ICD-10-CM | POA: Diagnosis not present

## 2017-10-08 HISTORY — PX: ABDOMINAL EXPOSURE: SHX5708

## 2017-10-08 HISTORY — PX: ANTERIOR LUMBAR FUSION: SHX1170

## 2017-10-08 LAB — POCT PREGNANCY, URINE: Preg Test, Ur: NEGATIVE

## 2017-10-08 SURGERY — ANTERIOR LUMBAR FUSION 1 LEVEL
Anesthesia: General

## 2017-10-08 MED ORDER — ACETAMINOPHEN 500 MG PO TABS
1000.0000 mg | ORAL_TABLET | Freq: Once | ORAL | Status: AC
  Administered 2017-10-08: 1000 mg via ORAL
  Filled 2017-10-08: qty 2

## 2017-10-08 MED ORDER — METHOCARBAMOL 500 MG PO TABS
500.0000 mg | ORAL_TABLET | Freq: Four times a day (QID) | ORAL | Status: DC | PRN
  Administered 2017-10-08 – 2017-10-11 (×11): 500 mg via ORAL
  Filled 2017-10-08 (×10): qty 1

## 2017-10-08 MED ORDER — SCOPOLAMINE 1 MG/3DAYS TD PT72
MEDICATED_PATCH | TRANSDERMAL | Status: AC
  Filled 2017-10-08: qty 1

## 2017-10-08 MED ORDER — CLONIDINE HCL 0.2 MG PO TABS
0.2000 mg | ORAL_TABLET | Freq: Once | ORAL | Status: AC
  Administered 2017-10-08: 0.2 mg via ORAL
  Filled 2017-10-08: qty 1

## 2017-10-08 MED ORDER — 0.9 % SODIUM CHLORIDE (POUR BTL) OPTIME
TOPICAL | Status: DC | PRN
  Administered 2017-10-08 (×2): 1000 mL

## 2017-10-08 MED ORDER — SODIUM CHLORIDE 0.9% FLUSH
3.0000 mL | Freq: Two times a day (BID) | INTRAVENOUS | Status: DC
  Administered 2017-10-09 – 2017-10-11 (×2): 3 mL via INTRAVENOUS

## 2017-10-08 MED ORDER — OXYCODONE-ACETAMINOPHEN 5-325 MG PO TABS
1.0000 | ORAL_TABLET | Freq: Four times a day (QID) | ORAL | Status: DC | PRN
  Administered 2017-10-09 – 2017-10-11 (×9): 2 via ORAL
  Filled 2017-10-08 (×9): qty 2

## 2017-10-08 MED ORDER — THROMBIN 5000 UNITS EX SOLR
CUTANEOUS | Status: AC
  Filled 2017-10-08: qty 5000

## 2017-10-08 MED ORDER — HYDROMORPHONE HCL 1 MG/ML IJ SOLN
INTRAMUSCULAR | Status: AC
  Filled 2017-10-08: qty 1

## 2017-10-08 MED ORDER — PHENYLEPHRINE HCL 10 MG/ML IJ SOLN
INTRAMUSCULAR | Status: DC | PRN
  Administered 2017-10-08: 120 ug via INTRAVENOUS

## 2017-10-08 MED ORDER — MIDAZOLAM HCL 2 MG/2ML IJ SOLN
INTRAMUSCULAR | Status: AC
  Filled 2017-10-08: qty 2

## 2017-10-08 MED ORDER — SENNA 8.6 MG PO TABS
1.0000 | ORAL_TABLET | Freq: Two times a day (BID) | ORAL | Status: DC
  Administered 2017-10-08 – 2017-10-11 (×6): 8.6 mg via ORAL
  Filled 2017-10-08 (×6): qty 1

## 2017-10-08 MED ORDER — ROCURONIUM BROMIDE 10 MG/ML (PF) SYRINGE
PREFILLED_SYRINGE | INTRAVENOUS | Status: AC
  Filled 2017-10-08: qty 5

## 2017-10-08 MED ORDER — SUGAMMADEX SODIUM 200 MG/2ML IV SOLN
INTRAVENOUS | Status: DC | PRN
  Administered 2017-10-08: 170 mg via INTRAVENOUS

## 2017-10-08 MED ORDER — HYDROMORPHONE HCL 1 MG/ML IJ SOLN
0.5000 mg | INTRAMUSCULAR | Status: DC | PRN
  Administered 2017-10-08 – 2017-10-10 (×10): 0.5 mg via INTRAVENOUS
  Filled 2017-10-08 (×11): qty 0.5

## 2017-10-08 MED ORDER — FENTANYL CITRATE (PF) 100 MCG/2ML IJ SOLN
INTRAMUSCULAR | Status: DC | PRN
  Administered 2017-10-08 (×2): 50 ug via INTRAVENOUS
  Administered 2017-10-08: 100 ug via INTRAVENOUS
  Administered 2017-10-08 (×8): 50 ug via INTRAVENOUS
  Administered 2017-10-08: 100 ug via INTRAVENOUS

## 2017-10-08 MED ORDER — CEFAZOLIN SODIUM-DEXTROSE 2-4 GM/100ML-% IV SOLN
2.0000 g | INTRAVENOUS | Status: AC
  Administered 2017-10-08: 2 g via INTRAVENOUS

## 2017-10-08 MED ORDER — DEXAMETHASONE SODIUM PHOSPHATE 10 MG/ML IJ SOLN
INTRAMUSCULAR | Status: DC | PRN
  Administered 2017-10-08: 10 mg via INTRAVENOUS

## 2017-10-08 MED ORDER — ONDANSETRON HCL 4 MG PO TABS
4.0000 mg | ORAL_TABLET | Freq: Four times a day (QID) | ORAL | Status: DC | PRN
  Administered 2017-10-10: 4 mg via ORAL
  Filled 2017-10-08: qty 1

## 2017-10-08 MED ORDER — METHOCARBAMOL 500 MG PO TABS
ORAL_TABLET | ORAL | Status: AC
  Filled 2017-10-08: qty 1

## 2017-10-08 MED ORDER — LIDOCAINE 2% (20 MG/ML) 5 ML SYRINGE
INTRAMUSCULAR | Status: AC
Start: 2017-10-08 — End: 2017-10-08
  Filled 2017-10-08: qty 5

## 2017-10-08 MED ORDER — GABAPENTIN 300 MG PO CAPS
300.0000 mg | ORAL_CAPSULE | Freq: Once | ORAL | Status: AC
  Administered 2017-10-08: 300 mg via ORAL
  Filled 2017-10-08: qty 1

## 2017-10-08 MED ORDER — MIDAZOLAM HCL 5 MG/5ML IJ SOLN
INTRAMUSCULAR | Status: DC | PRN
  Administered 2017-10-08: 2 mg via INTRAVENOUS

## 2017-10-08 MED ORDER — SODIUM CHLORIDE 0.9 % IV SOLN: 250.0000 mL | INTRAVENOUS | Status: DC

## 2017-10-08 MED ORDER — MEPERIDINE HCL 50 MG/ML IJ SOLN: 6.2500 mg | INTRAMUSCULAR | Status: DC | PRN

## 2017-10-08 MED ORDER — LIDOCAINE HCL (CARDIAC) 20 MG/ML IV SOLN
INTRAVENOUS | Status: DC | PRN
  Administered 2017-10-08: 100 mg via INTRAVENOUS

## 2017-10-08 MED ORDER — POTASSIUM CHLORIDE IN NACL 20-0.9 MEQ/L-% IV SOLN
INTRAVENOUS | Status: DC
  Administered 2017-10-08: 13:00:00 via INTRAVENOUS
  Filled 2017-10-08 (×2): qty 1000

## 2017-10-08 MED ORDER — ACETAMINOPHEN 10 MG/ML IV SOLN
1000.0000 mg | Freq: Once | INTRAVENOUS | Status: DC | PRN
  Administered 2017-10-08: 1000 mg via INTRAVENOUS

## 2017-10-08 MED ORDER — ACETAMINOPHEN 650 MG RE SUPP: 650.0000 mg | RECTAL | Status: DC | PRN

## 2017-10-08 MED ORDER — CHLORHEXIDINE GLUCONATE CLOTH 2 % EX PADS: 6.0000 | MEDICATED_PAD | Freq: Once | CUTANEOUS | Status: DC

## 2017-10-08 MED ORDER — OXYCODONE-ACETAMINOPHEN 7.5-325 MG PO TABS
ORAL_TABLET | ORAL | Status: AC
  Filled 2017-10-08: qty 1

## 2017-10-08 MED ORDER — ONDANSETRON HCL 4 MG/2ML IJ SOLN
INTRAMUSCULAR | Status: DC | PRN
  Administered 2017-10-08: 4 mg via INTRAVENOUS

## 2017-10-08 MED ORDER — METHOCARBAMOL 1000 MG/10ML IJ SOLN
500.0000 mg | Freq: Four times a day (QID) | INTRAVENOUS | Status: DC | PRN
  Filled 2017-10-08: qty 5

## 2017-10-08 MED ORDER — FENTANYL CITRATE (PF) 250 MCG/5ML IJ SOLN
INTRAMUSCULAR | Status: AC
  Filled 2017-10-08: qty 5

## 2017-10-08 MED ORDER — PROMETHAZINE HCL 25 MG/ML IJ SOLN: 6.2500 mg | INTRAMUSCULAR | Status: DC | PRN

## 2017-10-08 MED ORDER — SODIUM CHLORIDE 0.9% FLUSH
3.0000 mL | INTRAVENOUS | Status: DC | PRN
  Administered 2017-10-10: 3 mL via INTRAVENOUS
  Filled 2017-10-08: qty 3

## 2017-10-08 MED ORDER — DEXAMETHASONE SODIUM PHOSPHATE 10 MG/ML IJ SOLN
10.0000 mg | INTRAMUSCULAR | Status: DC
  Filled 2017-10-08: qty 1

## 2017-10-08 MED ORDER — THROMBIN (RECOMBINANT) 5000 UNITS EX SOLR
OROMUCOSAL | Status: DC | PRN
  Administered 2017-10-08: 07:00:00 via TOPICAL

## 2017-10-08 MED ORDER — KETOROLAC TROMETHAMINE 30 MG/ML IJ SOLN
30.0000 mg | Freq: Once | INTRAMUSCULAR | Status: AC
  Administered 2017-10-08: 30 mg via INTRAVENOUS
  Filled 2017-10-08: qty 1

## 2017-10-08 MED ORDER — HYDROMORPHONE HCL 1 MG/ML IJ SOLN
0.2500 mg | INTRAMUSCULAR | Status: DC | PRN
  Administered 2017-10-08 (×2): 0.5 mg via INTRAVENOUS

## 2017-10-08 MED ORDER — KETOROLAC TROMETHAMINE 30 MG/ML IJ SOLN
30.0000 mg | Freq: Four times a day (QID) | INTRAMUSCULAR | Status: AC
  Administered 2017-10-08 – 2017-10-09 (×3): 30 mg via INTRAVENOUS
  Filled 2017-10-08 (×3): qty 1

## 2017-10-08 MED ORDER — DEXAMETHASONE SODIUM PHOSPHATE 4 MG/ML IJ SOLN
4.0000 mg | Freq: Four times a day (QID) | INTRAMUSCULAR | Status: AC
  Administered 2017-10-08 – 2017-10-09 (×4): 4 mg via INTRAVENOUS
  Filled 2017-10-08 (×4): qty 1

## 2017-10-08 MED ORDER — ACETAMINOPHEN 10 MG/ML IV SOLN
INTRAVENOUS | Status: AC
  Filled 2017-10-08: qty 100

## 2017-10-08 MED ORDER — ONDANSETRON HCL 4 MG/2ML IJ SOLN: 4.0000 mg | Freq: Four times a day (QID) | INTRAMUSCULAR | Status: DC | PRN

## 2017-10-08 MED ORDER — OXYCODONE-ACETAMINOPHEN 5-325 MG PO TABS
1.0000 | ORAL_TABLET | Freq: Four times a day (QID) | ORAL | Status: DC | PRN
  Administered 2017-10-08 (×2): 1 via ORAL
  Filled 2017-10-08: qty 1

## 2017-10-08 MED ORDER — OXYCODONE-ACETAMINOPHEN 5-325 MG PO TABS
ORAL_TABLET | ORAL | Status: AC
  Filled 2017-10-08: qty 1

## 2017-10-08 MED ORDER — HYDROCODONE-ACETAMINOPHEN 7.5-325 MG PO TABS: 1.0000 | ORAL_TABLET | Freq: Once | ORAL | Status: DC | PRN

## 2017-10-08 MED ORDER — ROCURONIUM BROMIDE 100 MG/10ML IV SOLN
INTRAVENOUS | Status: DC | PRN
  Administered 2017-10-08 (×3): 50 mg via INTRAVENOUS

## 2017-10-08 MED ORDER — PROPOFOL 10 MG/ML IV BOLUS
INTRAVENOUS | Status: DC | PRN
  Administered 2017-10-08: 200 mg via INTRAVENOUS

## 2017-10-08 MED ORDER — HYDROMORPHONE HCL 1 MG/ML IJ SOLN
0.2500 mg | INTRAMUSCULAR | Status: DC | PRN
  Administered 2017-10-08 (×4): 0.5 mg via INTRAVENOUS

## 2017-10-08 MED ORDER — PROPOFOL 10 MG/ML IV BOLUS
INTRAVENOUS | Status: AC
  Filled 2017-10-08: qty 20

## 2017-10-08 MED ORDER — PREDNISONE 5 MG PO TABS
5.0000 mg | ORAL_TABLET | Freq: Every day | ORAL | Status: DC
  Administered 2017-10-09 – 2017-10-11 (×3): 5 mg via ORAL
  Filled 2017-10-08 (×3): qty 1

## 2017-10-08 MED ORDER — PHENOL 1.4 % MT LIQD: 1.0000 | OROMUCOSAL | Status: DC | PRN

## 2017-10-08 MED ORDER — CEFAZOLIN SODIUM-DEXTROSE 2-4 GM/100ML-% IV SOLN
2.0000 g | Freq: Three times a day (TID) | INTRAVENOUS | Status: AC
  Administered 2017-10-08 (×2): 2 g via INTRAVENOUS
  Filled 2017-10-08 (×2): qty 100

## 2017-10-08 MED ORDER — LACTATED RINGERS IV SOLN
INTRAVENOUS | Status: DC | PRN
  Administered 2017-10-08 (×3): via INTRAVENOUS

## 2017-10-08 MED ORDER — MENTHOL 3 MG MT LOZG: 1.0000 | LOZENGE | OROMUCOSAL | Status: DC | PRN

## 2017-10-08 MED ORDER — ACETAMINOPHEN 325 MG PO TABS
650.0000 mg | ORAL_TABLET | ORAL | Status: DC | PRN
  Administered 2017-10-09 – 2017-10-10 (×3): 650 mg via ORAL
  Filled 2017-10-08 (×3): qty 2

## 2017-10-08 MED ORDER — SODIUM CHLORIDE 0.9 % IR SOLN
Status: DC | PRN
  Administered 2017-10-08: 07:00:00

## 2017-10-08 MED FILL — Sodium Chloride IV Soln 0.9%: INTRAVENOUS | Qty: 1000 | Status: AC

## 2017-10-08 MED FILL — Heparin Sodium (Porcine) Inj 1000 Unit/ML: INTRAMUSCULAR | Qty: 30 | Status: AC

## 2017-10-08 SURGICAL SUPPLY — 88 items
APPLIER CLIP 11 MED OPEN (CLIP) ×2
BAG DECANTER FOR FLEXI CONT (MISCELLANEOUS) ×2 IMPLANT
BENZOIN TINCTURE PRP APPL 2/3 (GAUZE/BANDAGES/DRESSINGS) ×2 IMPLANT
BONE VIVIGEN FORMABLE 10CC (Bone Implant) ×2 IMPLANT
BUR BARREL STRAIGHT FLUTE 4.0 (BURR) ×2 IMPLANT
BUR MATCHSTICK NEURO 3.0 LAGG (BURR) IMPLANT
CANISTER SUCT 3000ML PPV (MISCELLANEOUS) ×2 IMPLANT
CARTRIDGE OIL MAESTRO DRILL (MISCELLANEOUS) ×1 IMPLANT
CLIP APPLIE 11 MED OPEN (CLIP) ×1 IMPLANT
CLIP LIGATING EXTRA MED SLVR (CLIP) IMPLANT
CLIP LIGATING EXTRA SM BLUE (MISCELLANEOUS) IMPLANT
DERMABOND ADVANCED (GAUZE/BANDAGES/DRESSINGS) ×1
DERMABOND ADVANCED .7 DNX12 (GAUZE/BANDAGES/DRESSINGS) ×1 IMPLANT
DIFFUSER DRILL AIR PNEUMATIC (MISCELLANEOUS) ×2 IMPLANT
DRAPE C-ARM 42X72 X-RAY (DRAPES) ×2 IMPLANT
DRAPE LAPAROTOMY 100X72X124 (DRAPES) ×2 IMPLANT
DRAPE POUCH INSTRU U-SHP 10X18 (DRAPES) ×2 IMPLANT
DRSG OPSITE POSTOP 4X6 (GAUZE/BANDAGES/DRESSINGS) ×2 IMPLANT
DURAPREP 26ML APPLICATOR (WOUND CARE) ×2 IMPLANT
ELECT BLADE 4.0 EZ CLEAN MEGAD (MISCELLANEOUS) ×2
ELECT REM PT RETURN 9FT ADLT (ELECTROSURGICAL) ×2
ELECTRODE BLDE 4.0 EZ CLN MEGD (MISCELLANEOUS) ×1 IMPLANT
ELECTRODE REM PT RTRN 9FT ADLT (ELECTROSURGICAL) ×1 IMPLANT
GAUZE SPONGE 4X4 12PLY STRL (GAUZE/BANDAGES/DRESSINGS) IMPLANT
GAUZE SPONGE 4X4 16PLY XRAY LF (GAUZE/BANDAGES/DRESSINGS) IMPLANT
GLOVE BIO SURGEON STRL SZ7 (GLOVE) ×2 IMPLANT
GLOVE BIO SURGEON STRL SZ8 (GLOVE) ×4 IMPLANT
GLOVE BIOGEL PI IND STRL 7.0 (GLOVE) ×3 IMPLANT
GLOVE BIOGEL PI IND STRL 7.5 (GLOVE) ×2 IMPLANT
GLOVE BIOGEL PI INDICATOR 7.0 (GLOVE) ×3
GLOVE BIOGEL PI INDICATOR 7.5 (GLOVE) ×2
GLOVE SS BIOGEL STRL SZ 7.5 (GLOVE) ×1 IMPLANT
GLOVE SS N UNI LF 7.5 STRL (GLOVE) IMPLANT
GLOVE SUPERSENSE BIOGEL SZ 7.5 (GLOVE) ×1
GLOVE SURG SS PI 7.0 STRL IVOR (GLOVE) ×4 IMPLANT
GOWN STRL REUS W/ TWL LRG LVL3 (GOWN DISPOSABLE) ×3 IMPLANT
GOWN STRL REUS W/ TWL XL LVL3 (GOWN DISPOSABLE) ×2 IMPLANT
GOWN STRL REUS W/TWL 2XL LVL3 (GOWN DISPOSABLE) IMPLANT
GOWN STRL REUS W/TWL LRG LVL3 (GOWN DISPOSABLE) ×3
GOWN STRL REUS W/TWL XL LVL3 (GOWN DISPOSABLE) ×2
HEMOSTAT POWDER KIT SURGIFOAM (HEMOSTASIS) ×2 IMPLANT
INSERT FOGARTY 61MM (MISCELLANEOUS) IMPLANT
INSERT FOGARTY SM (MISCELLANEOUS) IMPLANT
KIT BASIN OR (CUSTOM PROCEDURE TRAY) ×2 IMPLANT
KIT ROOM TURNOVER OR (KITS) ×2 IMPLANT
LOOP VESSEL MAXI BLUE (MISCELLANEOUS) IMPLANT
LOOP VESSEL MINI RED (MISCELLANEOUS) IMPLANT
NEEDLE SPNL 18GX3.5 QUINCKE PK (NEEDLE) ×2 IMPLANT
NS IRRIG 1000ML POUR BTL (IV SOLUTION) ×4 IMPLANT
OIL CARTRIDGE MAESTRO DRILL (MISCELLANEOUS) ×2
PACK LAMINECTOMY NEURO (CUSTOM PROCEDURE TRAY) ×2 IMPLANT
PAD ARMBOARD 7.5X6 YLW CONV (MISCELLANEOUS) ×4 IMPLANT
PIN FIXATION SPINAL F/ALIF (PIN) ×2 IMPLANT
PLATE ALIF SPINAL 12MM (Plate) ×2 IMPLANT
SCREW 25MM (Screw) ×2 IMPLANT
SCREW 30MM (Screw) ×4 IMPLANT
SCREW BN 25X6XNS SPNE ALIF (Screw) ×2 IMPLANT
SPACER ALIF LW 8X34X24 10D TI (Spacer) ×2 IMPLANT
SPONGE INTESTINAL PEANUT (DISPOSABLE) ×4 IMPLANT
SPONGE LAP 18X18 X RAY DECT (DISPOSABLE) ×2 IMPLANT
SPONGE LAP 4X18 X RAY DECT (DISPOSABLE) IMPLANT
SPONGE SURGIFOAM ABS GEL 100 (HEMOSTASIS) IMPLANT
STAPLER VISISTAT 35W (STAPLE) IMPLANT
STRIP CLOSURE SKIN 1/2X4 (GAUZE/BANDAGES/DRESSINGS) IMPLANT
SUT PDS AB 0 CT 36 (SUTURE) ×4 IMPLANT
SUT PROLENE 4 0 RB 1 (SUTURE)
SUT PROLENE 4-0 RB1 .5 CRCL 36 (SUTURE) IMPLANT
SUT PROLENE 5 0 CC1 (SUTURE) IMPLANT
SUT PROLENE 6 0 C 1 30 (SUTURE) ×2 IMPLANT
SUT PROLENE 6 0 CC (SUTURE) IMPLANT
SUT SILK 0 TIES 10X30 (SUTURE) ×2 IMPLANT
SUT SILK 2 0 TIES 10X30 (SUTURE) ×2 IMPLANT
SUT SILK 2 0SH CR/8 30 (SUTURE) IMPLANT
SUT SILK 3 0 TIES 10X30 (SUTURE) ×2 IMPLANT
SUT SILK 3 0 TIES 17X18 (SUTURE)
SUT SILK 3 0SH CR/8 30 (SUTURE) IMPLANT
SUT SILK 3-0 18XBRD TIE BLK (SUTURE) IMPLANT
SUT VIC AB 0 CT1 18XCR BRD8 (SUTURE) IMPLANT
SUT VIC AB 0 CT1 27 (SUTURE) ×2
SUT VIC AB 0 CT1 27XBRD ANBCTR (SUTURE) ×2 IMPLANT
SUT VIC AB 0 CT1 8-18 (SUTURE)
SUT VIC AB 2-0 CP2 18 (SUTURE) ×2 IMPLANT
SUT VIC AB 3-0 SH 8-18 (SUTURE) ×6 IMPLANT
SUT VICRYL 4-0 PS2 18IN ABS (SUTURE) ×2 IMPLANT
TOWEL GREEN STERILE (TOWEL DISPOSABLE) ×2 IMPLANT
TOWEL GREEN STERILE FF (TOWEL DISPOSABLE) ×2 IMPLANT
TRAY FOLEY W/METER SILVER 16FR (SET/KITS/TRAYS/PACK) ×2 IMPLANT
WATER STERILE IRR 1000ML POUR (IV SOLUTION) ×2 IMPLANT

## 2017-10-08 NOTE — Anesthesia Procedure Notes (Signed)
Procedure Name: Intubation Date/Time: 10/08/2017 7:45 AM Performed by: Lance Coon, CRNA Pre-anesthesia Checklist: Patient identified, Emergency Drugs available, Suction available, Patient being monitored and Timeout performed Patient Re-evaluated:Patient Re-evaluated prior to induction Oxygen Delivery Method: Circle system utilized Preoxygenation: Pre-oxygenation with 100% oxygen Induction Type: IV induction Laryngoscope Size: Miller and 3 Grade View: Grade I Tube type: Oral Tube size: 7.0 mm Number of attempts: 1 Airway Equipment and Method: Stylet Placement Confirmation: ETT inserted through vocal cords under direct vision,  positive ETCO2 and breath sounds checked- equal and bilateral Secured at: 21 cm Tube secured with: Tape Dental Injury: Teeth and Oropharynx as per pre-operative assessment

## 2017-10-08 NOTE — Progress Notes (Signed)
Patient ID: Kathleen Ashley, female   DOB: 03-23-1981, 37 y.o.   MRN: [id number] Pain improving but was having severe back and L ant/lateral thigh pain with numbness, legs have good strength

## 2017-10-08 NOTE — Anesthesia Postprocedure Evaluation (Signed)
Anesthesia Post Note  Patient: Kathleen Ashley  Procedure(s) Performed: Anterior Lumbar Interbody Fusion  - Lumbar five Sacral one (N/A ) ABDOMINAL EXPOSURE (N/A )     Patient location during evaluation: PACU Anesthesia Type: General Level of consciousness: awake and alert Pain management: pain level controlled Vital Signs Assessment: post-procedure vital signs reviewed and stable Respiratory status: spontaneous breathing, nonlabored ventilation, respiratory function stable and patient connected to nasal cannula oxygen Cardiovascular status: blood pressure returned to baseline and stable Postop Assessment: no apparent nausea or vomiting Anesthetic complications: no    Last Vitals:  Vitals:   10/08/17 1223 10/08/17 1300  BP:  130/71  Pulse: 84 87  Resp: 13 11  Temp: (!) 36.4 C 36.6 C  SpO2: 94% 96%    Last Pain:  Vitals:   10/08/17 1300  TempSrc: Oral  PainSc: 9                  Barnet Glasgow

## 2017-10-08 NOTE — Op Note (Signed)
    OPERATIVE REPORT  DATE OF SURGERY: 10/08/2017  PATIENT: Kathleen Ashley, 37 y.o. female MRN: [id number]  DOB: November 03, 1980  PRE-OPERATIVE DIAGNOSIS: Degenerative disc disease L5-S1  POST-OPERATIVE DIAGNOSIS:  Same  PROCEDURE: Anterior exposure for interbody fusion  SURGEON:  Curt Jews, M.D.  Co-surgeon for the exposure Dr. Sherley Bounds  ANESTHESIA: General  EBL: 50 ml  Total I/O In: 2000 [I.V.:2000] Out: 150 [Urine:100; Blood:50]  BLOOD ADMINISTERED: None  DRAINS: None  SPECIMEN: None  COUNTS CORRECT:  YES  PLAN OF CARE: PACU  PATIENT DISPOSITION:  PACU - hemodynamically stable  PROCEDURE DETAILS: The patient was taken to the operating room placed in supine position where the area the abdomen was prepped and draped in the usual sterile fashion.  Lateral C arm projection was used to determine the level of the L5-S1 disc in relationship to the anterior abdominal wall.  The patient has had a prior abdominoplasty following bariatric surgery.  She had an incision from lateral at her iliac crest from right to left.  This same incision did match with the needed exposure for her fusion.  An incision was made through the old scar from the midline to the left and was carried down through the subcutaneous fat with electrocautery.  The anterior rectus sheath was opened in line with the skin incision and the rectus muscle was mobilized circumferentially.  The retroperitoneal space was opened bluntly and dissection was continued to mobilize the intraperitoneal contents to the right and the left ureter was identified and it was mobilized to the right as well.  The patient had a posterior discectomy in the past and there was significant scarring on the anterior surface of the L5-S1 disc.  This was mobilized bluntly for exposure and mobilizing the iliac vessels to the right and left of the L5-S1 disc.  Middle sacral vessels were clipped and divided.  Continued blunt dissection gave adequate  exposure.  The Thompson retractor was brought onto the field and the reverse lip 150 blades were positioned to the right and left of the L5-S1 disc.  Malleable retractors were used for superior and inferior exposure.  The spinal needle was placed in the L5-S1 disc and C-arm was brought back onto the field to confirm that this was the appropriate disc level.  The remainder of the procedure will be dictated as a separate note by Dr. Jennye Moccasin, M.D., [facility] 10/08/2017 10:02 AM

## 2017-10-08 NOTE — H&P (Signed)
Subjective: Patient is a 37 y.o. female admitted for ALIF. Onset of symptoms was several months ago, gradually worsening since that time.  The pain is rated severe, and is located at the across the lower back and radiates to leg . The pain is described as aching and occurs all day. The symptoms have been progressive. Symptoms are exacerbated by exercise. MRI or CT showed DDD L5-s1   Past Medical History:  Diagnosis Date  . DDD (degenerative disc disease)   . Depression   . Low back pain   . Lupus   . Lupus (systemic lupus erythematosus) ([place])     Past Surgical History:  Procedure Laterality Date  . abdominal surgery     "tummy tuck"  . BACK SURGERY    . BREAST SURGERY     breast reduction  . GASTRIC BYPASS    . LUMBAR LAMINECTOMY     L5-S1 on the right    Prior to Admission medications   Medication Sig Start Date End Date Taking? Authorizing Provider  cyclobenzaprine (FLEXERIL) 5 MG tablet Take 5 mg by mouth at bedtime. 08/30/17  Yes [provider]  ibuprofen (ADVIL,MOTRIN) 800 MG tablet Take 1 tablet (800 mg total) by mouth 3 (three) times daily. Patient taking differently: Take 800 mg by mouth every 8 (eight) hours as needed for headache or moderate pain.  08/31/15  Yes Muthersbaugh, Jarrett Soho, PA-C  Multiple Vitamin (MULTIVITAMIN WITH MINERALS) TABS tablet Take 1 tablet by mouth daily. Reported on 02/11/2016   Yes [provider]  Omega-3 Fatty Acids (FISH OIL PO) Take 1 capsule by mouth daily.   Yes [provider]  oxyCODONE-acetaminophen (PERCOCET/ROXICET) 5-325 MG tablet Take 1 tablet by mouth every 6 (six) hours as needed for severe pain. 09/10/17  Yes [provider]  predniSONE (DELTASONE) 5 MG tablet Take 5 mg by mouth once daily 05/24/17  Yes [provider]  TURMERIC PO Take 1 capsule by mouth daily. Reported on 02/11/2016   Yes [provider]  DULoxetine (CYMBALTA) 30 MG capsule Take 1 capsule (30 mg total) by mouth  daily. Patient not taking: Reported on 09/15/2017 08/12/17   Hoyt Koch, MD   Allergies  Allergen Reactions  . Celebrex [Celecoxib] Other (See Comments)    Headache    Social History   Tobacco Use  . Smoking status: Never Smoker  . Smokeless tobacco: Never Used  Substance Use Topics  . Alcohol use: No    Family History  Problem Relation Age of Onset  . Diabetes Sister      Review of Systems  Positive ROS: neg  All other systems have been reviewed and were otherwise negative with the exception of those mentioned in the HPI and as above.  Objective: Vital signs in last 24 hours: Temp:  [98.3 F (36.8 C)] 98.3 F (36.8 C) (02/15 0609) Pulse Rate:  [82] 82 (02/15 0609) Resp:  [18] 18 (02/15 0609) BP: (125)/(67) 125/67 (02/15 0609) SpO2:  [99 %] 99 % (02/15 0609) Weight:  [84.8 kg (187 lb)] 84.8 kg (187 lb) (02/15 0604)  General Appearance: Alert, cooperative, no distress, appears stated age Head: Normocephalic, without obvious abnormality, atraumatic Eyes: PERRL, conjunctiva/corneas clear, EOM's intact    Neck: Supple, symmetrical, trachea midline Back: Symmetric, no curvature, ROM normal, no CVA tenderness Lungs:  respirations unlabored Heart: Regular rate and rhythm Abdomen: Soft, non-tender Extremities: Extremities normal, atraumatic, no cyanosis or edema Pulses: 2+ and symmetric all extremities Skin: Skin color, texture, turgor normal,  no rashes or lesions  NEUROLOGIC:   Mental status: Alert and oriented x4,  no aphasia, good attention span, fund of knowledge, and memory Motor Exam - grossly normal Sensory Exam - grossly normal Reflexes: 1+ Coordination - grossly normal Gait - grossly normal Balance - grossly normal Cranial Nerves: I: smell Not tested  II: visual acuity  OS: nl    OD: nl  II: visual fields Full to confrontation  II: pupils Equal, round, reactive to light  III,VII: ptosis None  III,IV,VI: extraocular muscles  Full ROM  V:  mastication Normal  V: facial light touch sensation  Normal  V,VII: corneal reflex  Present  VII: facial muscle function - upper  Normal  VII: facial muscle function - lower Normal  VIII: hearing Not tested  IX: soft palate elevation  Normal  IX,X: gag reflex Present  XI: trapezius strength  5/5  XI: sternocleidomastoid strength 5/5  XI: neck flexion strength  5/5  XII: tongue strength  Normal    Data Review Lab Results  Component Value Date   WBC 8.4 09/24/2017   HGB 11.5 (L) 09/24/2017   HCT 35.7 (L) 09/24/2017   MCV 79.9 09/24/2017   PLT 375 09/24/2017   Lab Results  Component Value Date   NA 139 09/24/2017   K 4.1 09/24/2017   CL 108 09/24/2017   CO2 21 (L) 09/24/2017   BUN 14 09/24/2017   CREATININE 0.78 09/24/2017   GLUCOSE 80 09/24/2017   Lab Results  Component Value Date   INR 0.98 09/24/2017    Assessment/Plan:  Estimated body mass index is 30.18 kg/m as calculated from the following:   Height as of this encounter: 5\' 6"  (1.676 m).   Weight as of this encounter: 84.8 kg (187 lb). Patient admitted for ALF L5-S1. Patient has failed a reasonable attempt at conservative therapy.  I explained the condition and procedure to the patient and answered any questions.  Patient wishes to proceed with procedure as planned. Understands risks/ benefits and typical outcomes of procedure.   Roderick Sweezy S 10/08/2017 7:24 AM

## 2017-10-08 NOTE — Progress Notes (Signed)
Pt with lots of pain  After pain meds per anesthesia, Dr Valma Cava made aware and more Dilaudid orderd

## 2017-10-08 NOTE — Op Note (Signed)
10/08/2017  10:19 AM  PATIENT:  Kathleen Ashley  37 y.o. female  PRE-OPERATIVE DIAGNOSIS:  Failed back syndrome, degenerative disc disease L5-S1 with small recurrent disc herniation, back and leg pain  POST-OPERATIVE DIAGNOSIS:  same  PROCEDURE:  Anterior lumbar interbody fusion L5-S1 with porous titanium interbody cage packed with morcellized allograft, followed by anterior lumbar plating with Alphatec plate  SURGEON:  Sherley Bounds, MD  Co-surgeon: Early MD  ASSISTANTSHenderson Cloud FNP  ANESTHESIA:   General  EBL: 100 ml  Total I/O In: 2000 [I.V.:2000] Out: 200 [Urine:100; Blood:100]  BLOOD ADMINISTERED: none  DRAINS: None  SPECIMEN:  none  INDICATION FOR PROCEDURE: This patient presented with severe back and right leg pain. Previous surgery L5-S1 right.. Imaging showed moderate disc herniation with degenerative disc disease L5-S1. The patient tried conservative measures without relief. Pain was debilitating. Recommended anterior lumbar interbody fusion L5-S1. Patient understood the risks, benefits, and alternatives and potential outcomes and wished to proceed.  PROCEDURE DETAILS: The patient was taken to the operating room and after induction of adequate generalized endotracheal anesthesia she was placed in a supine position on the operating table. Her abdominal region was cleaned and then prepped with DuraPrep and draped in usual sterile fashion. The exposure was performed by Dr. Sherren Mocha early vascular surgery and this will be dictated in a separate operative report. After the exposure, a needle was placed in the disc space we marked the midline at L5-S1 with AP fluoroscopy and confirmed our level with lateral fluoroscopy. I then incised the disc space and performed the initial discectomy with pituitary rongeurs. I released the remaining disc from the endplates with a Cobb elevator. The disc space to 6 mm. I then used a high-speed drill to drill the endplates to prepare for arthrodesis. I  drilled down to the level of the posterior space. I then used sequential trials. The 12 mm anterior trial worked the best. We loaded a 12 mm cage and packed this with morcellized allograft and tapped this into position at L5-S1 utilizing lateral fluoroscopy. We then used anterior lumbar plate and utilizing fluoroscopy placed 25 mm screws into the sacrum and 30 mm screws into L5. These were locked into the plate by the locking mechanism within the plate. We then irrigated with saline solution. We checked the final construct with AP lateral fluoroscopy. I packed around the cage with the remaining allograft. The retractors were removed and there was no obvious bleeding. We then closed the rectus fascia with a running 2-0 Prolene, as the subcutaneous tissue with 2-0 Vicryl in the subcuticular tissue with 3-0 Vicryl. The skin was closed with Dermabond. The drapes were removed. The patient was awakened from general anesthesia and transferred to the recovery room in stable condition. At the end of the procedure all sponge needle and instrument counts were correct.   PLAN OF CARE: Admit for overnight observation  PATIENT DISPOSITION:  PACU - hemodynamically stable.   Delay start of Pharmacological VTE agent (>24hrs) due to surgical blood loss or risk of bleeding:  yes

## 2017-10-08 NOTE — Progress Notes (Signed)
Patient C/O severe pain that has bee refractory to the ordered pain medications. Patient also C/O pain and numbness in her left lower extremity.  Patient is tachycardic at this time, HR - 127.  Dr Ronnald Ramp called and informed.  Orders received.

## 2017-10-08 NOTE — Progress Notes (Signed)
PT Cancellation Note  Patient Details Name: Kathleen Ashley MRN: [id number] DOB: 1981-01-28   Cancelled Treatment:    Reason Eval/Treat Not Completed: Other (comment);Pain limiting ability to participate. Pt crying in pain.  PT will check back in AM.  Thanks,    Wells Guiles B. [place], [place], DPT [phone]   10/08/2017, 3:40 PM

## 2017-10-08 NOTE — Transfer of Care (Signed)
Immediate Anesthesia Transfer of Care Note  Patient: Kathleen Ashley  Procedure(s) Performed: Anterior Lumbar Interbody Fusion  - Lumbar five Sacral one (N/A ) ABDOMINAL EXPOSURE (N/A )  Patient Location: PACU  Anesthesia Type:General  Level of Consciousness: awake and patient cooperative  Airway & Oxygen Therapy: Patient Spontanous Breathing  Post-op Assessment: Report given to RN and Post -op Vital signs reviewed and stable  Post vital signs: Reviewed and stable  Last Vitals:  Vitals:   10/08/17 0609  BP: 125/67  Pulse: 82  Resp: 18  Temp: 36.8 C  SpO2: 99%    Last Pain:  Vitals:   10/08/17 0609  TempSrc: Oral  PainSc:       Patients Stated Pain Goal: 3 (40/37/09 6438)  Complications: No apparent anesthesia complications

## 2017-10-09 MED ORDER — DEXAMETHASONE SODIUM PHOSPHATE 10 MG/ML IJ SOLN
10.0000 mg | Freq: Once | INTRAMUSCULAR | Status: AC
  Administered 2017-10-09: 10 mg via INTRAVENOUS
  Filled 2017-10-09: qty 1

## 2017-10-09 MED ORDER — LACTATED RINGERS IV SOLN
INTRAVENOUS | Status: DC
  Administered 2017-10-09: 21:00:00 via INTRAVENOUS

## 2017-10-09 NOTE — Progress Notes (Signed)
   VASCULAR SURGERY ASSESSMENT & PLAN:   1 Day Post-Op s/p: Anterior lumbar interbody fusion of L5-S1.  Doing well.  Vascular surgery will be available as needed.   SUBJECTIVE:   Moderate incisional and back discomfort  PHYSICAL EXAM:   Vitals:   10/08/17 2100 10/08/17 2200 10/08/17 2300 10/09/17 0455  BP: 107/76 (!) 118/58 (!) 107/47 (!) 112/56  Pulse: 100   66  Resp: 20 (!) 23 19 13   Temp:   98.5 F (36.9 C) (!) 97.5 F (36.4 C)  TempSrc:   Oral Oral  SpO2: 97%     Weight:      Height:       Palpable DP pulse on left  LABS:   Lab Results  Component Value Date   WBC 8.4 09/24/2017   HGB 11.5 (L) 09/24/2017   HCT 35.7 (L) 09/24/2017   MCV 79.9 09/24/2017   PLT 375 09/24/2017   Lab Results  Component Value Date   CREATININE 0.78 09/24/2017   Lab Results  Component Value Date   INR 0.98 09/24/2017    PROBLEM LIST:    Active Problems:   S/P lumbar spinal fusion   CURRENT MEDS:   . dexamethasone  4 mg Intravenous Q6H  . ketorolac  30 mg Intravenous Q6H  . predniSONE  5 mg Oral Q breakfast  . senna  1 tablet Oral BID  . sodium chloride flush  3 mL Intravenous Q12H    Deitra Mayo Beeper: [phone] Office: [phone] 10/09/2017

## 2017-10-09 NOTE — Progress Notes (Signed)
Patient ID: [name], female   DOB: [birth year], 37 y.o.   MRN: [id number] Vital signs are stable Patient complains of pain in the low back with tingling into both lower extremities The abdomen is soft incision is clean and dry She notes the pain has been much more intolerable today than it was yesterday I reviewed her meds and note that she is on significant narcotic analgesics in addition to Toradol She's been receiving some Decadron I suggested a bolus dose of 10 mg now We'll continue to follow along clinically Encouraged ambulation as this would help to decrease her pain Supportive care

## 2017-10-09 NOTE — Evaluation (Signed)
Physical Therapy Evaluation Patient Details Name: Kathleen Ashley MRN: [id number] DOB: Mar 29, 1981 Today's Date: 10/09/2017   History of Present Illness  37 yo admitted for L5-S1 ALIF. PMHx: DDD, depression, lami, SLE  Clinical Impression  Pt pleasant, reports decreased pain post medication and very willing to get up and OOB. Pt able to ambulate to bathroom, perform pericare, walk in hall way, stand to brush teeth with cues and sit in chair. Pt with decreased transfers, gait and function who will benefit from acute therapy to maximize mobility, transfers, function and safety to decrease burden of care. Pt educated for all precautions, transfers, gait and progression.      Follow Up Recommendations No PT follow up    Equipment Recommendations  None recommended by PT    Recommendations for Other Services       Precautions / Restrictions Precautions Precautions: Back Precaution Booklet Issued: Yes (comment) Precaution Comments: no order for brace, pt reports instructed to wear for support as needed Required Braces or Orthoses: Spinal Brace Spinal Brace: Lumbar corset      Mobility  Bed Mobility Overal bed mobility: Needs Assistance Bed Mobility: Rolling;Sidelying to Sit Rolling: Supervision Sidelying to sit: Supervision       General bed mobility comments: cues for sequence and precautions  Transfers Overall transfer level: Needs assistance   Transfers: Sit to/from Stand Sit to Stand: Supervision         General transfer comment: cues for hand placement and safety  Ambulation/Gait Ambulation/Gait assistance: Supervision Ambulation Distance (Feet): 160 Feet Assistive device: Rolling walker (2 wheeled) Gait Pattern/deviations: Step-through pattern;Decreased stride length   Gait velocity interpretation: Below normal speed for age/gender General Gait Details: cues for posture, shoulder depression and position in RW. Pt able to walk without RW last 15' with improved  posture, slow cautious gait  Stairs            Wheelchair Mobility    Modified Rankin (Stroke Patients Only)       Balance Overall balance assessment: No apparent balance deficits (not formally assessed)                                           Pertinent Vitals/Pain Pain Assessment: 0-10 Pain Score: 5  Pain Location: right leg and incision Pain Descriptors / Indicators: Sore Pain Intervention(s): Limited activity within patient's tolerance;Repositioned;Monitored during session;Premedicated before session    Home Living Family/patient expects to be discharged to:: Private residence Living Arrangements: Spouse/significant other Available Help at Discharge: Family;Available 24 hours/day Type of Home: House       Home Layout: Two level;Bed/bath upstairs Home Equipment: Crutches;Shower seat      Prior Function Level of Independence: Needs assistance   Gait / Transfers Assistance Needed: independent, was doing yoga  ADL's / Homemaking Assistance Needed: assist for putting on socks, shaving lower legs        Hand Dominance        Extremity/Trunk Assessment   Upper Extremity Assessment Upper Extremity Assessment: Defer to OT evaluation    Lower Extremity Assessment Lower Extremity Assessment: RLE deficits/detail RLE Deficits / Details: pt reports numbness and tingling    Cervical / Trunk Assessment Cervical / Trunk Assessment: Other exceptions Cervical / Trunk Exceptions: post surgical, guarding abdomen  Communication   Communication: No difficulties  Cognition Arousal/Alertness: Awake/alert Behavior During Therapy: WFL for tasks assessed/performed Overall Cognitive Status: Within Functional Limits  for tasks assessed                                        General Comments      Exercises     Assessment/Plan    PT Assessment Patient needs continued PT services  PT Problem List Decreased mobility;Decreased  activity tolerance;Decreased knowledge of use of DME;Pain;Decreased knowledge of precautions       PT Treatment Interventions Gait training;Patient/family education;Therapeutic exercise;Stair training;Functional mobility training;DME instruction;Therapeutic activities    PT Goals (Current goals can be found in the Care Plan section)  Acute Rehab PT Goals Patient Stated Goal: return home PT Goal Formulation: With patient Time For Goal Achievement: 10/23/17 Potential to Achieve Goals: Good    Frequency Min 5X/week   Barriers to discharge        Co-evaluation               AM-PAC PT "6 Clicks" Daily Activity  Outcome Measure Difficulty turning over in bed (including adjusting bedclothes, sheets and blankets)?: A Lot Difficulty moving from lying on back to sitting on the side of the bed? : A Lot Difficulty sitting down on and standing up from a chair with arms (e.g., wheelchair, bedside commode, etc,.)?: A Little Help needed moving to and from a bed to chair (including a wheelchair)?: A Little Help needed walking in hospital room?: A Little Help needed climbing 3-5 steps with a railing? : A Little 6 Click Score: 16    End of Session Equipment Utilized During Treatment: Back brace Activity Tolerance: Patient tolerated treatment well Patient left: in chair;with call bell/phone within reach;with family/visitor present Nurse Communication: Mobility status;Precautions PT Visit Diagnosis: Other abnormalities of gait and mobility (R26.89)    Time: 1638-4536 PT Time Calculation (min) (ACUTE ONLY): 29 min   Charges:   PT Evaluation $PT Eval Moderate Complexity: 1 Mod PT Treatments $Gait Training: 8-22 mins   PT G Codes:        Elwyn Reach, PT [phone]   [place] 10/09/2017, 10:17 AM

## 2017-10-09 NOTE — Evaluation (Signed)
Occupational Therapy Evaluation Patient Details Name: Kathleen Ashley MRN: [id number] DOB: 09-16-1980 Today's Date: 10/09/2017    History of Present Illness 37 y.o. admitted for L5-S1 ALIF. PMHx: DDD, depression, lami, SLE   Clinical Impression   Pt s/p above. Pt getting assist with ADLs, PTA. Education provided in session and pt verbalized understanding. OT signing off.    Follow Up Recommendations  No OT follow up;Supervision - Intermittent    Equipment Recommendations  3 in 1 bedside commode    Recommendations for Other Services       Precautions / Restrictions Precautions Precautions: Back Precaution Booklet Issued: (one in room) Precaution Comments: no order for brace, pt reports instructed to wear for support as needed Required Braces or Orthoses: Spinal Brace Spinal Brace: Lumbar corset Restrictions Weight Bearing Restrictions: No      Mobility Bed Mobility       General bed mobility comments: not assessed  Transfers Overall transfer level: Needs assistance   Transfers: Sit to/from Stand Sit to Stand: Min guard                ADL either performed or assessed with clinical judgement   ADL Overall ADL's : Needs assistance/impaired                     Lower Body Dressing: Sit to/from stand;Set up;Supervision/safety   Toilet Transfer: Min guard;Ambulation(Min guard-sit to stand; Supervision-ambulation)       Tub/ Shower Transfer: Supervision/safety;Ambulation;Walk-in shower;Set up   Functional mobility during ADLs: Min guard(Supervision-ambulation; Min guard-sit to stand) General ADL Comments: Educated on safety such as items on floor. Pt verbalized understanding of back brace. Educated on LB ADL technique. Educated on tips to help maintain back precautions. Educated on what pt could use for toilet aid if needed.  Explained use of 3 in 1.     Vision         Perception     Praxis      Pertinent Vitals/Pain Pain Assessment: 0-10 Pain  Score: (4-5) Pain Location: right leg, lower back, and right foot Pain Descriptors / Indicators: Burning;Aching;Throbbing;Tingling Pain Intervention(s): Monitored during session;Repositioned     Hand Dominance Right   Extremity/Trunk Assessment Upper Extremity Assessment Upper Extremity Assessment: Overall WFL for tasks assessed   Lower Extremity Assessment Lower Extremity Assessment: Defer to PT evaluation RLE Deficits / Details: pt reports numbness and tingling   Cervical / Trunk Assessment Cervical / Trunk Assessment: Other exceptions Cervical / Trunk Exceptions: post surgical, guarding abdomen   Communication Communication Communication: No difficulties   Cognition Arousal/Alertness: Awake/alert Behavior During Therapy: WFL for tasks assessed/performed Overall Cognitive Status: Within Functional Limits for tasks assessed                                     General Comments       Exercises     Shoulder Instructions      Home Living Family/patient expects to be discharged to:: Private residence Living Arrangements: Spouse/significant other Available Help at Discharge: Family;Available 24 hours/day Type of Home: House       Home Layout: Two level;Bed/bath upstairs Alternate Level Stairs-Number of Steps: 14   Bathroom Shower/Tub: Walk-in shower;Tub only   Bathroom Toilet: Standard     Home Equipment: Crutches;Shower seat          Prior Functioning/Environment Level of Independence: Needs assistance  Gait / Transfers Assistance Needed: independent,  was doing yoga ADL's / Homemaking Assistance Needed: assist for putting on socks, shaving lower legs            OT Problem List: Pain;Decreased activity tolerance      OT Treatment/Interventions:      OT Goals(Current goals can be found in the care plan section)   OT Frequency:     Barriers to D/C:            Co-evaluation              AM-PAC PT "6 Clicks" Daily Activity      Outcome Measure Help from another person eating meals?: None Help from another person taking care of personal grooming?: A Little Help from another person toileting, which includes using toliet, bedpan, or urinal?: A Little Help from another person bathing (including washing, rinsing, drying)?: A Little Help from another person to put on and taking off regular upper body clothing?: A Little Help from another person to put on and taking off regular lower body clothing?: A Little 6 Click Score: 19   End of Session Equipment Utilized During Treatment: Back brace;Other (comment)(AE)  Activity Tolerance: Patient tolerated treatment well Patient left: in chair;with family/visitor present;with call bell/phone within reach  OT Visit Diagnosis: Pain Pain - Right/Left: Right(and back) Pain - part of body: Leg;Hip(foot and back )                Time: 0539-7673 OT Time Calculation (min): 19 min Charges:  OT General Charges $OT Visit: 1 Visit OT Evaluation $OT Eval Low Complexity: 1 Low G-Codes:     Shomari Matusik L Anahli Arvanitis OTR/L 10/09/2017, 10:35 AM

## 2017-10-10 MED ORDER — BISACODYL 5 MG PO TBEC
10.0000 mg | DELAYED_RELEASE_TABLET | Freq: Every day | ORAL | Status: DC | PRN
  Administered 2017-10-10: 10 mg via ORAL
  Filled 2017-10-10: qty 2

## 2017-10-10 MED ORDER — FLEET ENEMA 7-19 GM/118ML RE ENEM: 1.0000 | ENEMA | Freq: Once | RECTAL | Status: DC

## 2017-10-10 MED ORDER — KETOROLAC TROMETHAMINE 30 MG/ML IJ SOLN
30.0000 mg | Freq: Four times a day (QID) | INTRAMUSCULAR | Status: DC | PRN
  Administered 2017-10-10 – 2017-10-11 (×3): 30 mg via INTRAVENOUS
  Filled 2017-10-10 (×3): qty 1

## 2017-10-10 MED ORDER — BISACODYL 10 MG RE SUPP
10.0000 mg | Freq: Every day | RECTAL | Status: DC | PRN
  Administered 2017-10-10: 10 mg via RECTAL
  Filled 2017-10-10 (×2): qty 1

## 2017-10-10 MED ORDER — TIZANIDINE HCL 2 MG PO TABS
2.0000 mg | ORAL_TABLET | Freq: Four times a day (QID) | ORAL | Status: DC | PRN
  Administered 2017-10-10 – 2017-10-11 (×2): 4 mg via ORAL
  Filled 2017-10-10 (×3): qty 2

## 2017-10-10 NOTE — Progress Notes (Signed)
Patient ID: Kathleen Ashley, female   DOB: Jan 09, 1981, 37 y.o.   MRN: [id number] Vital signs are stable Patient's pain is under much better control today She has moved her bowels Would like to advance diet Mobilizing better but not quite ready for discharge Still on substantial doses of strong pain medicine

## 2017-10-10 NOTE — Care Management Note (Signed)
Case Management Note  Patient Details  Name: Micky Overturf MRN: [id number] Date of Birth: May 11, 1981  Subjective/Objective:            Pt from home with husband for lumbar surgery.  Pt states husband will stay with her 24/7 x 1 week and then brother will stay with her as needed when husband returns to work.  Pt states husband can drive her as needed and there are no barriers to health care.  Pt agrees to 3n1 and RW for home use.     Action/Plan: DME ordered from [facility] for delivery to room today.  Pt expects to d/c 2/18 or 2/19. No further CM needs at this time.   Expected Discharge Date:       10/11/17 or 10/12/17           Expected Discharge Plan:  Home/Self Care  In-House Referral:  NA  Discharge planning Services  CM Consult  Post Acute Care Choice:  Durable Medical Equipment Choice offered to:  Patient  DME Arranged:  3-N-1, Walker rolling DME Agency:  [place]:  NA [place] Agency:  NA  Status of Service:  Completed, signed off  If discussed at [place] of Stay Meetings, dates discussed:    Additional Comments:  Claudie Leach, RN 10/10/2017, 11:14 AM

## 2017-10-10 NOTE — Progress Notes (Signed)
Physical Therapy Treatment Patient Details Name: Kathleen Ashley MRN: [id number] DOB: 1981-07-11 Today's Date: 10/10/2017    History of Present Illness 37 y.o. admitted for L5-S1 ALIF. PMHx: DDD, depression, lami, SLE    PT Comments    Pt continues to progress, limited by pain but fairly well controlled when premedicated.  Able to perform stairs today.  Still reports tingling in RLE as nerve recovers.  Will benefit from multiple bouts of short distance walking daily using RW, and to keep pain meds on schedule.  PT will continue to see daily until goals met.     Follow Up Recommendations  No PT follow up     Equipment Recommendations  Rolling walker with 5" wheels    Recommendations for Other Services       Precautions / Restrictions Precautions Precautions: Back Precaution Booklet Issued: Yes (comment) Precaution Comments: no order for brace, pt reports instructed to wear for support as needed Required Braces or Orthoses: Spinal Brace Spinal Brace: Lumbar corset    Mobility  Bed Mobility               General bed mobility comments: not observed, up at EOB on arrival  Transfers Overall transfer level: Needs assistance   Transfers: Sit to/from Stand Sit to Stand: Supervision         General transfer comment: without device  Ambulation/Gait Ambulation/Gait assistance: Supervision;Min guard Ambulation Distance (Feet): 200 Feet Assistive device: None Gait Pattern/deviations: Step-through pattern;Decreased stride length;Decreased step length - right   Gait velocity interpretation: Below normal speed for age/gender General Gait Details: slow and steady with some favoring of right LE, pt will likely do ok without device, but RW will facilitate normalized gait so recommend use without PT and practice with/without when doing PT   Stairs Stairs: Yes   Stair Management: One rail Left;Step to pattern;Forwards Number of Stairs: 4(2 times back to back)    Wheelchair  Mobility    Modified Rankin (Stroke Patients Only)       Balance Overall balance assessment: No apparent balance deficits (not formally assessed)                                          Cognition Arousal/Alertness: Awake/alert Behavior During Therapy: WFL for tasks assessed/performed Overall Cognitive Status: Within Functional Limits for tasks assessed                                        Exercises      General Comments        Pertinent Vitals/Pain Pain Assessment: 0-10 Pain Score: 5  Pain Location: right leg, lower back, and right foot Pain Descriptors / Indicators: Burning;Aching;Throbbing;Tingling Pain Intervention(s): Limited activity within patient's tolerance;Monitored during session;Premedicated before session;Repositioned    Home Living                      Prior Function            PT Goals (current goals can now be found in the care plan section) Acute Rehab PT Goals Patient Stated Goal: return home Progress towards PT goals: Progressing toward goals    Frequency    Min 5X/week      PT Plan Current plan remains appropriate    Co-evaluation  AM-PAC PT "6 Clicks" Daily Activity  Outcome Measure  Difficulty turning over in bed (including adjusting bedclothes, sheets and blankets)?: A Little Difficulty moving from lying on back to sitting on the side of the bed? : A Little Difficulty sitting down on and standing up from a chair with arms (e.g., wheelchair, bedside commode, etc,.)?: A Little Help needed moving to and from a bed to chair (including a wheelchair)?: A Little Help needed walking in hospital room?: A Little Help needed climbing 3-5 steps with a railing? : A Little 6 Click Score: 18    End of Session Equipment Utilized During Treatment: Back brace Activity Tolerance: Patient tolerated treatment well Patient left: in chair;with call bell/phone within reach Nurse  Communication: Mobility status PT Visit Diagnosis: Other abnormalities of gait and mobility (R26.89)     Time: 3241-9914 PT Time Calculation (min) (ACUTE ONLY): 19 min  Charges:  $Gait Training: 8-22 mins                    G Codes:       Kearney Hard, PT, DPT Board Certified Geriatric Clinical Specialist   Herbie Drape 10/10/2017, 12:48 PM

## 2017-10-11 MED ORDER — OXYCODONE-ACETAMINOPHEN 5-325 MG PO TABS: 1.0000 | ORAL_TABLET | ORAL | 0 refills | Status: DC | PRN

## 2017-10-11 MED ORDER — CYCLOBENZAPRINE HCL 5 MG PO TABS: 5.0000 mg | ORAL_TABLET | Freq: Three times a day (TID) | ORAL | 1 refills | Status: DC | PRN

## 2017-10-11 NOTE — Discharge Summary (Signed)
Physician Discharge Summary  Patient ID: Kathleen Ashley MRN: [id number] DOB/AGE: 36-Apr-1982 36 y.o.  Admit date: 10/08/2017 Discharge date: 10/11/2017  Admission Diagnoses: DDD/ HNP L5-S1    Discharge Diagnoses: same   Discharged Condition: good  Hospital Course: The patient was admitted on 10/08/2017 and taken to the operating room where the patient underwent ALIF L5-S1. The patient tolerated the procedure well and was taken to the recovery room and then to the floor in stable condition. The hospital course was routine. There were no complications. The wound remained clean dry and intact. Pt had appropriate back soreness. No complaints of leg pain or new N/T/W. The patient remained afebrile with stable vital signs, and tolerated a regular diet. The patient continued to increase activities, and pain was well controlled with oral pain medications.   Consults: None  Significant Diagnostic Studies:  Results for orders placed or performed during the hospital encounter of 10/08/17  Pregnancy, urine POC  Result Value Ref Range   Preg Test, Ur NEGATIVE NEGATIVE    Chest 2 View  Result Date: 09/24/2017 CLINICAL DATA:  Preoperative examination. Patient for lumbar surgery 10/08/2017. EXAM: CHEST  2 VIEW COMPARISON:  PA and lateral chest 07/11/2015. FINDINGS: Lungs are clear. Heart size is normal. No pneumothorax or pleural effusion. No bony abnormality. IMPRESSION: Negative chest. Electronically Signed   By: Inge Rise M.D.   On: 09/24/2017 16:04   Dg Lumbar Spine 2-3 Views  Result Date: 10/08/2017 CLINICAL DATA:  Elective surgery.  L5-S1 ALIF EXAM: LUMBAR SPINE - 2-3 VIEW; DG C-ARM 61-120 MIN COMPARISON:  Lumbar MRI 07/27/2017 FINDINGS: Changes of ALIF at L5-S1 with ventral plate. Hardware is in expected position. No evidence of fracture. IMPRESSION: Fluoroscopy for L5-S1 ALIF.  No unexpected finding. Electronically Signed   By: Monte Fantasia M.D.   On: 10/08/2017 10:00   Dg C-arm 1-60  Min  Result Date: 10/08/2017 CLINICAL DATA:  Elective surgery.  L5-S1 ALIF EXAM: LUMBAR SPINE - 2-3 VIEW; DG C-ARM 61-120 MIN COMPARISON:  Lumbar MRI 07/27/2017 FINDINGS: Changes of ALIF at L5-S1 with ventral plate. Hardware is in expected position. No evidence of fracture. IMPRESSION: Fluoroscopy for L5-S1 ALIF.  No unexpected finding. Electronically Signed   By: Monte Fantasia M.D.   On: 10/08/2017 10:00    Antibiotics:  Anti-infectives (From admission, onward)   Start     Dose/Rate Route Frequency Ordered Stop   10/08/17 1500  ceFAZolin (ANCEF) IVPB 2g/100 mL premix     2 g 200 mL/hr over 30 Minutes Intravenous Every 8 hours 10/08/17 1238 10/08/17 2231   10/08/17 0720  bacitracin 50,000 Units in sodium chloride irrigation 0.9 % 500 mL irrigation  Status:  Discontinued       As needed 10/08/17 0838 10/08/17 1003   10/08/17 0551  ceFAZolin (ANCEF) IVPB 2g/100 mL premix     2 g 200 mL/hr over 30 Minutes Intravenous On call to O.R. 10/08/17 0551 10/08/17 0748      Discharge Exam: Blood pressure 107/68, pulse 68, temperature 97.8 F (36.6 C), temperature source Oral, resp. rate 18, height 5\' 6"  (1.676 m), weight 84.8 kg (187 lb), last menstrual period 09/21/2017, SpO2 99 %. Neurologic: Grossly normal Wound CDI  Discharge Medications:   Allergies as of 10/11/2017      Reactions   Celebrex [celecoxib] Other (See Comments)   Headache      Medication List    TAKE these medications   cyclobenzaprine 5 MG tablet Commonly known as:  FLEXERIL Take 1  tablet (5 mg total) by mouth 3 (three) times daily as needed for muscle spasms. What changed:    when to take this  reasons to take this   DULoxetine 30 MG capsule Commonly known as:  CYMBALTA Take 1 capsule (30 mg total) by mouth daily.   FISH OIL PO Take 1 capsule by mouth daily.   ibuprofen 800 MG tablet Commonly known as:  ADVIL,MOTRIN Take 1 tablet (800 mg total) by mouth 3 (three) times daily. What changed:    when to  take this  reasons to take this   multivitamin with minerals Tabs tablet Take 1 tablet by mouth daily. Reported on 02/11/2016   oxyCODONE-acetaminophen 5-325 MG tablet Commonly known as:  PERCOCET/ROXICET Take 1-2 tablets by mouth every 4 (four) hours as needed for severe pain. What changed:    how much to take  when to take this   predniSONE 5 MG tablet Commonly known as:  DELTASONE Take 5 mg by mouth once daily   TURMERIC PO Take 1 capsule by mouth daily. Reported on 02/11/2016            Durable Medical Equipment  (From admission, onward)        Start     Ordered   10/08/17 1239  DME Walker rolling  Once    Question:  Patient needs a walker to treat with the following condition  Answer:  S/P lumbar fusion   10/08/17 1238   10/08/17 1239  DME 3 n 1  Once     10/08/17 1238      Disposition: home   Final Dx: ALIF L5-S1  Discharge Instructions    Call MD for:  difficulty breathing, headache or visual disturbances   Complete by:  As directed    Call MD for:  persistant nausea and vomiting   Complete by:  As directed    Call MD for:  redness, tenderness, or signs of infection (pain, swelling, redness, odor or green/yellow discharge around incision site)   Complete by:  As directed    Call MD for:  severe uncontrolled pain   Complete by:  As directed    Call MD for:  temperature >100.4   Complete by:  As directed    Diet - low sodium heart healthy   Complete by:  As directed    Increase activity slowly   Complete by:  As directed          Signed: Hailynn Slovacek S 10/11/2017, 8:20 AM

## 2017-10-11 NOTE — Progress Notes (Addendum)
Patient with d/c orders however not managing pain on oral tablets, MD office paged [phone], spoke with Wells Guiles. Awaiting page back. MD paged back [phone], MD and RN discussed patient hospital course and progression thus far and comfort of d/c at this time. Pt to be discharged today.

## 2017-10-11 NOTE — Progress Notes (Signed)
Physical Therapy Treatment Patient Details Name: Kathleen Ashley MRN: [id number] DOB: [birth year] Today's Date: 10/11/2017    History of Present Illness 37 y.o. admitted for L5-S1 ALIF. PMHx: DDD, depression, lami, SLE    PT Comments    Pt in very good spirits with active morning having already showered and moved around. Pt able to state all precautions, walk and complete stairs without physical assist. Pt with increased pain in sitting and requesting pain meds. Pt safe for return home.     Follow Up Recommendations  No PT follow up     Equipment Recommendations  Rolling walker with 5" wheels    Recommendations for Other Services       Precautions / Restrictions Precautions Precautions: Back Precaution Comments: no order for brace, pt reports instructed to wear for support as needed    Mobility  Bed Mobility               General bed mobility comments: standing on arrival  Transfers Overall transfer level: Modified independent                  Ambulation/Gait Ambulation/Gait assistance: Supervision Ambulation Distance (Feet): 250 Feet Assistive device: Rolling walker (2 wheeled) Gait Pattern/deviations: Step-through pattern   Gait velocity interpretation: Below normal speed for age/gender General Gait Details: pt continues to feel better with RW for long hall ambulation and cues to depress shoulders and extend trunk   Stairs Stairs: Yes   Stair Management: Step to pattern;One rail Right Number of Stairs: 11 General stair comments: initial cues for sequence  Wheelchair Mobility    Modified Rankin (Stroke Patients Only)       Balance Overall balance assessment: No apparent balance deficits (not formally assessed)                                          Cognition Arousal/Alertness: Awake/alert Behavior During Therapy: WFL for tasks assessed/performed Overall Cognitive Status: Within Functional Limits for tasks assessed                                         Exercises      General Comments        Pertinent Vitals/Pain Pain Score: 6  Pain Location: right leg, lower back Pain Descriptors / Indicators: Aching Pain Intervention(s): Limited activity within patient's tolerance;Repositioned;Patient requesting pain meds-RN notified    Home Living                      Prior Function            PT Goals (current goals can now be found in the care plan section) Progress towards PT goals: Progressing toward goals    Frequency           PT Plan Current plan remains appropriate    Co-evaluation              AM-PAC PT "6 Clicks" Daily Activity  Outcome Measure  Difficulty turning over in bed (including adjusting bedclothes, sheets and blankets)?: A Little Difficulty moving from lying on back to sitting on the side of the bed? : A Little Difficulty sitting down on and standing up from a chair with arms (e.g., wheelchair, bedside commode, etc,.)?: A Little Help needed moving to and  from a bed to chair (including a wheelchair)?: None Help needed walking in hospital room?: None Help needed climbing 3-5 steps with a railing? : None 6 Click Score: 21    End of Session Equipment Utilized During Treatment: Back brace Activity Tolerance: Patient tolerated treatment well Patient left: in chair;with call bell/phone within reach;with family/visitor present;with nursing/sitter in room Nurse Communication: Mobility status PT Visit Diagnosis: Other abnormalities of gait and mobility (R26.89)     Time: 3128-1188 PT Time Calculation (min) (ACUTE ONLY): 15 min  Charges:  $Gait Training: 8-22 mins                    G Codes:       Elwyn Reach, PT [phone]    [place] 10/11/2017, 8:00 AM

## 2017-10-11 NOTE — Progress Notes (Signed)
Patient walked twice around the nurse unit with assist from spouse w/ walker. Patient is constipated and having abd cramping and muscle spasms. Notifed Dr Vertell Limber, orders received and meds given, see EMAR,

## 2017-10-12 ENCOUNTER — Encounter (HOSPITAL_COMMUNITY): Payer: Self-pay | Admitting: Neurological Surgery

## 2017-10-12 ENCOUNTER — Telehealth: Payer: Self-pay | Admitting: *Deleted

## 2017-10-12 NOTE — Telephone Encounter (Signed)
Pt was on TCM report admitted 10/08/2017 and taken to the operating room where the patient underwent ALIF L5-S1.Pt was d/c 10/11/17, and will f/u w/specialist in 2 weeks.Marland KitchenJohny Chess

## 2017-10-22 DIAGNOSIS — M961 Postlaminectomy syndrome, not elsewhere classified: Secondary | ICD-10-CM | POA: Diagnosis not present

## 2017-11-02 DIAGNOSIS — M961 Postlaminectomy syndrome, not elsewhere classified: Secondary | ICD-10-CM | POA: Diagnosis not present

## 2017-11-08 ENCOUNTER — Encounter (HOSPITAL_COMMUNITY): Payer: Self-pay | Admitting: Neurological Surgery

## 2017-11-10 ENCOUNTER — Telehealth: Payer: Self-pay | Admitting: Vascular Surgery

## 2017-11-10 ENCOUNTER — Encounter: Payer: BLUE CROSS/BLUE SHIELD | Admitting: Vascular Surgery

## 2017-11-10 NOTE — Telephone Encounter (Signed)
-----   Message from Mena Goes, RN sent at 10/08/2017  2:05 PM EST ----- Regarding: follow up appt with Dr. Scot Dock in 1 month to discuss new access   ----- Message ----- From: Rosetta Posner, MD Sent: 10/08/2017  10:10 AM To: [facility] Clinical [facility], [facility] Charge Pool  Single level spine exposure with Dr. Sherley Bounds co-surgeon  Lynita Lombard ligation and removal of her right upper arm AV graft for steal syndrome.  Needs follow-up visit with Dr. Scot Dock in 1 month

## 2017-11-10 NOTE — Telephone Encounter (Signed)
The previous staff message had two pts written in the message thought this was the pt attached to the staff message. Pt no showed the 11/10/17 appt, cxl'd it in the system and left a message apologizing and letting the pt know the appt was sched in error.

## 2017-11-10 NOTE — Telephone Encounter (Signed)
Missed putting staff mssg as a telephone encounter. Sched appt 11/10/17 at 11:15. Left 2 mssgs on the hm# to inform pt of appt.

## 2017-11-17 ENCOUNTER — Telehealth: Payer: Self-pay

## 2017-11-17 DIAGNOSIS — M329 Systemic lupus erythematosus, unspecified: Secondary | ICD-10-CM

## 2017-11-17 NOTE — Telephone Encounter (Signed)
Copied from [place] [phone]. Topic: Referral - Request >> Nov 17, 2017  3:58 PM Ivar Drape wrote: Reason for CRM:  Patient was seeing a Rheumatologist and their policy was to not miss an appt, but the patient has missed two appts.  So she will need a referral for a new Rheumatologist.

## 2017-11-19 NOTE — Telephone Encounter (Signed)
LVM informing patient.

## 2017-11-19 NOTE — Telephone Encounter (Signed)
Referral placed.

## 2017-11-22 ENCOUNTER — Telehealth: Payer: Self-pay

## 2017-11-22 NOTE — Telephone Encounter (Signed)
Copied from [place] [phone]. Topic: Referral - Request >> Nov 17, 2017  3:58 PM Ivar Drape wrote: Reason for CRM:  Patient was seeing a Rheumatologist and their policy was to not miss an appt, but the patient has missed two appts.  So she will need a referral for a new Rheumatologist.    >> Nov 19, 2017  5:01 PM Neva Seat wrote: Pt called back checking on the referral.  She states she didn't receive a voice mail today. Please call pt back to discuss.

## 2017-11-22 NOTE — Telephone Encounter (Signed)
Called patient back and left another voicemail informing that the referral has been placed and they will be calling her to schedule an appointment

## 2017-12-14 DIAGNOSIS — R9389 Abnormal findings on diagnostic imaging of other specified body structures: Secondary | ICD-10-CM | POA: Diagnosis not present

## 2017-12-14 DIAGNOSIS — D259 Leiomyoma of uterus, unspecified: Secondary | ICD-10-CM | POA: Diagnosis not present

## 2017-12-15 DIAGNOSIS — M549 Dorsalgia, unspecified: Secondary | ICD-10-CM | POA: Diagnosis not present

## 2017-12-15 DIAGNOSIS — M2550 Pain in unspecified joint: Secondary | ICD-10-CM | POA: Diagnosis not present

## 2017-12-15 DIAGNOSIS — R5383 Other fatigue: Secondary | ICD-10-CM | POA: Diagnosis not present

## 2017-12-15 DIAGNOSIS — M329 Systemic lupus erythematosus, unspecified: Secondary | ICD-10-CM | POA: Diagnosis not present

## 2017-12-16 DIAGNOSIS — Z79899 Other long term (current) drug therapy: Secondary | ICD-10-CM | POA: Diagnosis not present

## 2017-12-17 ENCOUNTER — Telehealth: Payer: Self-pay | Admitting: Internal Medicine

## 2017-12-17 MED ORDER — PREDNISONE 5 MG PO TABS: 5.0000 mg | ORAL_TABLET | Freq: Every day | ORAL | 0 refills | Status: DC

## 2017-12-17 NOTE — Telephone Encounter (Signed)
Patient informed. 

## 2017-12-17 NOTE — Telephone Encounter (Signed)
Have sent in 30 day supply. Further should come from rheumatology and she should call for refills prior to day of being out.

## 2017-12-17 NOTE — Telephone Encounter (Signed)
Copied from [place]. Topic: Quick Communication - See Telephone Encounter >> Dec 17, 2017  2:30 PM Percell Belt A wrote: CRM for notification. See Telephone encounter for: 12/17/17. Pt called in stated that her rheumatologist closed today and she is out of  predniSONE (DELTASONE) 5 MG tablet [[id number]].  She wanted to know if Dr Sharlet Salina could fill it urgently for her today?   She is concerned coming off of it?    Best number [phone]

## 2017-12-20 ENCOUNTER — Other Ambulatory Visit (HOSPITAL_COMMUNITY): Payer: Self-pay | Admitting: Obstetrics and Gynecology

## 2017-12-20 DIAGNOSIS — N920 Excessive and frequent menstruation with regular cycle: Secondary | ICD-10-CM

## 2017-12-23 ENCOUNTER — Encounter (HOSPITAL_COMMUNITY): Payer: Self-pay

## 2017-12-23 ENCOUNTER — Ambulatory Visit (HOSPITAL_COMMUNITY)
Admission: RE | Admit: 2017-12-23 | Discharge: 2017-12-23 | Disposition: A | Discharge status: Home / Self Care | Payer: BLUE CROSS/BLUE SHIELD | Source: Ambulatory Visit | Attending: Obstetrics and Gynecology | Admitting: Obstetrics and Gynecology

## 2017-12-28 DIAGNOSIS — D259 Leiomyoma of uterus, unspecified: Secondary | ICD-10-CM | POA: Diagnosis not present

## 2017-12-28 DIAGNOSIS — N920 Excessive and frequent menstruation with regular cycle: Secondary | ICD-10-CM | POA: Diagnosis not present

## 2017-12-28 DIAGNOSIS — Z3202 Encounter for pregnancy test, result negative: Secondary | ICD-10-CM | POA: Diagnosis not present

## 2017-12-30 DIAGNOSIS — F4312 Post-traumatic stress disorder, chronic: Secondary | ICD-10-CM | POA: Diagnosis not present

## 2018-01-05 DIAGNOSIS — M329 Systemic lupus erythematosus, unspecified: Secondary | ICD-10-CM | POA: Diagnosis not present

## 2018-01-05 DIAGNOSIS — M2550 Pain in unspecified joint: Secondary | ICD-10-CM | POA: Diagnosis not present

## 2018-01-05 DIAGNOSIS — M549 Dorsalgia, unspecified: Secondary | ICD-10-CM | POA: Diagnosis not present

## 2018-01-05 DIAGNOSIS — R5383 Other fatigue: Secondary | ICD-10-CM | POA: Diagnosis not present

## 2018-01-06 DIAGNOSIS — Z79891 Long term (current) use of opiate analgesic: Secondary | ICD-10-CM | POA: Diagnosis not present

## 2018-01-07 DIAGNOSIS — N644 Mastodynia: Secondary | ICD-10-CM | POA: Diagnosis not present

## 2018-01-10 ENCOUNTER — Ambulatory Visit: Payer: BLUE CROSS/BLUE SHIELD | Attending: Neurological Surgery | Admitting: Physical Therapy

## 2018-01-10 ENCOUNTER — Other Ambulatory Visit: Payer: Self-pay

## 2018-01-10 DIAGNOSIS — M545 Low back pain, unspecified: Secondary | ICD-10-CM

## 2018-01-10 DIAGNOSIS — F411 Generalized anxiety disorder: Secondary | ICD-10-CM | POA: Diagnosis not present

## 2018-01-10 DIAGNOSIS — F332 Major depressive disorder, recurrent severe without psychotic features: Secondary | ICD-10-CM | POA: Diagnosis not present

## 2018-01-10 DIAGNOSIS — M6281 Muscle weakness (generalized): Secondary | ICD-10-CM | POA: Diagnosis not present

## 2018-01-10 NOTE — Patient Instructions (Signed)
Bridge   Lie back, legs bent. Inhale, pressing hips up. Keeping ribs in, lengthen lower back. Repeat 10-30 times. Do __2__ sessions per day.  Copyright  VHI. All rights reserved.  Abdominal bracing   Flatten back by tightening stomach muscles and buttocks. Hold 5-10 seconds. Repeat _10___ times per set. Do _1-3___ sets per session. Do __2__ sessions per day. Add, clams, marching and heel slides as tolerated.  http://orth.exer.us/134   Copyright  VHI. All rights reserved. Knee to Chest (Flexion)   Pull knee toward chest. Feel stretch in lower back or buttock area. You may need to put the other leg straight to feel a better stretch. Breathing deeply, Hold __30__ seconds. Repeat with other knee. Repeat _3___ times each leg. Do _2___ sessions per day.  http://gt2.exer.us/225   Copyright  VHI. All rights reserved.   Knee-to-Chest Stretch: Unilateral    With hand behind right knee, pull knee in to chest until a comfortable stretch is felt in lower back and buttocks. Keep back relaxed. Hold _30___ seconds. Repeat _3___ times per set. Do ____ sets per session. Do __2__ sessions per day.     Lower Trunk Rotation Stretch   Keeping back flat and feet together, rotate knees to left side. Hold __10__ seconds. Repeat __5__ times per set. Do ____ sets per session. Do _2_ sessions per day.  http://orth.exer.us/122   Copyright  VHI. All rights reserved.  Supine: Leg Stretch With Strap (Basic)   Lie on back with one knee bent, foot flat on floor. Hook strap around other foot. Straighten knee. Keep knee level with other knee. Hold 30 - 60 seconds. Relax leg completely down to floor.  Repeat _3__ times per session. Do _2__ sessions per day.   Madelyn Flavors, PT 01/10/18 2:22 PM

## 2018-01-10 NOTE — Therapy (Signed)
[facility] [place], [place], [address] Phone: [phone]   Fax:  [phone]  Physical Therapy Evaluation  Patient Details  Name: Kathleen Ashley MRN: [id number] Date of Birth: 1980/11/27 Referring Provider: Berle Mull MD   Encounter Date: 01/10/2018  PT End of Session - 01/10/18 1345    Visit Number  1    Number of Visits  16    Date for PT Re-Evaluation  03/07/18    PT Start Time  2637    PT Stop Time  1425    PT Time Calculation (min)  40 min    Activity Tolerance  Patient tolerated treatment well    Behavior During Therapy  [facility] for tasks assessed/performed       Past Medical History:  Diagnosis Date  . DDD (degenerative disc disease)   . Depression   . Low back pain   . Lupus   . Lupus (systemic lupus erythematosus) ([place])     Past Surgical History:  Procedure Laterality Date  . ABDOMINAL EXPOSURE N/A 10/08/2017   Procedure: ABDOMINAL EXPOSURE;  Surgeon: Rosetta Posner, MD;  Location: [facility] OR;  Service: Vascular;  Laterality: N/A;  . abdominal surgery     "tummy tuck"  . ANTERIOR LUMBAR FUSION N/A 10/08/2017   Procedure: Anterior Lumbar Interbody Fusion  - Lumbar five Sacral one;  Surgeon: Eustace Moore, MD;  Location: [facility];  Service: Neurosurgery;  Laterality: N/A;  . BACK SURGERY    . BREAST SURGERY     breast reduction  . GASTRIC BYPASS    . LUMBAR LAMINECTOMY     L5-S1 on the right    There were no vitals filed for this visit.   Subjective Assessment - 01/10/18 1353    Subjective  Patient had surgery 10/08/17 to fuse L5/S1. She had previous laminectomy 2014. Patient reports aching with prolonged sitting. She is starting to feel her muscles a lot more. No pain with walking. Patient does report bil  foot pain prolonged standing. No foot pain in morning.    Limitations  Sitting    How long can you sit comfortably?  30 min    Patient Stated Goals  to get stronger and get rid of pain    Currently in Pain?  No/denies         [facility] PT Assessment - 01/10/18 0001      Assessment   Medical Diagnosis  s/p lumbar fusion     Referring Provider  Berle Mull MD    Onset Date/Surgical Date  10/08/17    Next MD Visit  01/11/18      Precautions   Precaution Comments  no heavy lifting      Balance Screen   Has the patient fallen in the past 6 months  No    Has the patient had a decrease in activity level because of a fear of falling?   No    Is the patient reluctant to leave their home because of a fear of falling?   No      Home Environment   Living Environment  Private residence    Type of Home  House    Additional Comments  2 levels      Prior Function   Level of Independence  Independent    Vocation Requirements  photographer      Observation/Other Assessments   Focus on Therapeutic Outcomes (FOTO)   53% limited  Posture/Postural Control   Posture Comments  increased lumbar lordosis      ROM / Strength   AROM / PROM / Strength  AROM;Strength      AROM   Overall AROM Comments  Lumbar WFL, flexion not fully tested      Strength   Overall Strength Comments  LLE 5/5, R gastroc, knee flex, 4+/5; Bil hip ext 4/5, lower abdominal strength deficits      Flexibility   Soft Tissue Assessment /Muscle Length  yes    Hamstrings  mild tightness R >L    Piriformis  WNL      Palpation   Palpation comment  unremarkable                Objective measurements completed on examination: See above findings.              PT Education - 01/10/18 1442    Education provided  Yes    Education Details  HEP    Person(s) Educated  Patient    Methods  Explanation;Demonstration;Handout;Tactile cues;Verbal cues    Comprehension  Verbalized understanding;Returned demonstration       PT Short Term Goals - 01/10/18 1439      PT SHORT TERM GOAL #1   Title  I with initial HEP    Time  2    Period  Weeks    Status  New        PT Long Term Goals -  01/10/18 1440      PT LONG TERM GOAL #1   Title  independent with HEP for lumbar stabilization and core.    Time  8    Period  Weeks    Status  New      PT LONG TERM GOAL #2   Title  Patient able to sit > 30 min with 50% less pain.    Time  8    Period  Weeks    Status  New      PT LONG TERM GOAL #3   Title  Patient able to demonstrate correct body mechanics with lifting.    Time  8    Period  Weeks    Status  New             Plan - 01/10/18 1431    Clinical Impression Statement  Patient presents today for low complexity evaluation for low back pain s/p lumbar fusion on 10/08/17. She has decreased sittiing tolerance, weakness in her core and BLE and decreased ROM. Patient will benefit from PT to address these deficits.    History and Personal Factors relevant to plan of care:  ant lumbar fusion L5/S1 10/08/17; lumbar laminectomy 2014    Clinical Decision Making  Low    Rehab Potential  Excellent    PT Frequency  2x / week    PT Duration  8 weeks    PT Treatment/Interventions  ADLs/Self Care Home Management;Cryotherapy;Electrical Stimulation;Moist Heat;Therapeutic exercise;Therapeutic activities;Neuromuscular re-education;Patient/family education    PT Next Visit Plan  lumbar stabilization, LE strengthening, body mechanics education    PT Home Exercise Plan  level 1 lumbar stab, bridging, SKTC, LTR    Consulted and Agree with Plan of Care  Patient       Patient will benefit from skilled therapeutic intervention in order to improve the following deficits and impairments:  Pain, Postural dysfunction, Decreased range of motion, Decreased strength  Visit Diagnosis: Acute bilateral low back pain without sciatica - Plan: PT plan of  care cert/re-cert, CANCELED: PT plan of care cert/re-cert  Muscle weakness (generalized) - Plan: PT plan of care cert/re-cert, CANCELED: PT plan of care cert/re-cert     Problem List Patient Active Problem List   Diagnosis Date Noted  . S/P  lumbar spinal fusion 10/08/2017  . Major depression, recurrent ([place]) 08/13/2017  . Systemic lupus erythematosus ([place]) 10/20/2016  . Spondylosis of lumbar region without myelopathy or radiculopathy 10/20/2016  . Other fatigue 10/20/2016  . Alopecia 10/20/2016  . Vitamin D deficiency 10/20/2016  . History of gastric bypass 10/20/2016  . Encounter for genetic counseling 02/13/2016    Pascuala Klutts PT 01/10/2018, 3:08 PM  [place] [address] [place], [place], [address] Phone: [phone]   Fax:  [phone]  Name: Kathleen Ashley MRN: [id number] Date of Birth: 18-Sep-1980

## 2018-01-17 DIAGNOSIS — F4323 Adjustment disorder with mixed anxiety and depressed mood: Secondary | ICD-10-CM | POA: Diagnosis not present

## 2018-01-18 DIAGNOSIS — F331 Major depressive disorder, recurrent, moderate: Secondary | ICD-10-CM | POA: Diagnosis not present

## 2018-01-20 ENCOUNTER — Encounter: Payer: Self-pay | Admitting: Internal Medicine

## 2018-01-20 DIAGNOSIS — N644 Mastodynia: Secondary | ICD-10-CM | POA: Diagnosis not present

## 2018-01-20 LAB — HM MAMMOGRAPHY

## 2018-01-28 DIAGNOSIS — F4312 Post-traumatic stress disorder, chronic: Secondary | ICD-10-CM | POA: Diagnosis not present

## 2018-01-31 ENCOUNTER — Encounter: Payer: Self-pay | Admitting: Internal Medicine

## 2018-02-01 DIAGNOSIS — F4312 Post-traumatic stress disorder, chronic: Secondary | ICD-10-CM | POA: Diagnosis not present

## 2018-02-07 DIAGNOSIS — M329 Systemic lupus erythematosus, unspecified: Secondary | ICD-10-CM | POA: Diagnosis not present

## 2018-02-07 DIAGNOSIS — F4312 Post-traumatic stress disorder, chronic: Secondary | ICD-10-CM | POA: Diagnosis not present

## 2018-02-07 DIAGNOSIS — M549 Dorsalgia, unspecified: Secondary | ICD-10-CM | POA: Diagnosis not present

## 2018-02-07 DIAGNOSIS — R5383 Other fatigue: Secondary | ICD-10-CM | POA: Diagnosis not present

## 2018-02-07 DIAGNOSIS — M2550 Pain in unspecified joint: Secondary | ICD-10-CM | POA: Diagnosis not present

## 2018-02-08 DIAGNOSIS — F331 Major depressive disorder, recurrent, moderate: Secondary | ICD-10-CM | POA: Diagnosis not present

## 2018-02-22 DIAGNOSIS — F4312 Post-traumatic stress disorder, chronic: Secondary | ICD-10-CM | POA: Diagnosis not present

## 2018-02-23 DIAGNOSIS — F331 Major depressive disorder, recurrent, moderate: Secondary | ICD-10-CM | POA: Diagnosis not present

## 2018-03-02 DIAGNOSIS — Z79891 Long term (current) use of opiate analgesic: Secondary | ICD-10-CM | POA: Diagnosis not present

## 2018-03-02 DIAGNOSIS — F331 Major depressive disorder, recurrent, moderate: Secondary | ICD-10-CM | POA: Diagnosis not present

## 2018-03-07 DIAGNOSIS — M961 Postlaminectomy syndrome, not elsewhere classified: Secondary | ICD-10-CM | POA: Diagnosis not present

## 2018-03-10 DIAGNOSIS — F4323 Adjustment disorder with mixed anxiety and depressed mood: Secondary | ICD-10-CM | POA: Diagnosis not present

## 2018-03-10 DIAGNOSIS — F331 Major depressive disorder, recurrent, moderate: Secondary | ICD-10-CM | POA: Diagnosis not present

## 2018-03-14 DIAGNOSIS — D259 Leiomyoma of uterus, unspecified: Secondary | ICD-10-CM | POA: Diagnosis not present

## 2018-03-14 DIAGNOSIS — E259 Adrenogenital disorder, unspecified: Secondary | ICD-10-CM | POA: Diagnosis not present

## 2018-03-24 DIAGNOSIS — Z79891 Long term (current) use of opiate analgesic: Secondary | ICD-10-CM | POA: Diagnosis not present

## 2018-03-24 DIAGNOSIS — F331 Major depressive disorder, recurrent, moderate: Secondary | ICD-10-CM | POA: Diagnosis not present

## 2018-03-29 ENCOUNTER — Ambulatory Visit: Payer: BLUE CROSS/BLUE SHIELD | Admitting: Internal Medicine

## 2018-04-05 ENCOUNTER — Encounter: Payer: Self-pay | Admitting: Internal Medicine

## 2018-04-05 ENCOUNTER — Ambulatory Visit: Payer: BLUE CROSS/BLUE SHIELD | Admitting: Internal Medicine

## 2018-04-05 VITALS — BP 110/70 | HR 91 | Temp 98.6°F | Ht 66.0 in | Wt 173.0 lb

## 2018-04-05 DIAGNOSIS — Z0000 Encounter for general adult medical examination without abnormal findings: Secondary | ICD-10-CM | POA: Diagnosis not present

## 2018-04-05 DIAGNOSIS — F4310 Post-traumatic stress disorder, unspecified: Secondary | ICD-10-CM | POA: Diagnosis not present

## 2018-04-05 DIAGNOSIS — M3219 Other organ or system involvement in systemic lupus erythematosus: Secondary | ICD-10-CM

## 2018-04-05 DIAGNOSIS — Z981 Arthrodesis status: Secondary | ICD-10-CM

## 2018-04-05 MED ORDER — CYCLOBENZAPRINE HCL 5 MG PO TABS: 5.0000 mg | ORAL_TABLET | Freq: Two times a day (BID) | ORAL | 3 refills | Status: DC | PRN

## 2018-04-05 NOTE — Patient Instructions (Signed)

## 2018-04-05 NOTE — Progress Notes (Signed)
   Subjective:    Patient ID: Kathleen Ashley, female    DOB: 05-08-1981, 37 y.o.   MRN: [id number]  HPI The patient is a 37 YO female coming in for physical. Had back surgery in February and is released from that. Continues to see rheumatology for her lupus but off prednisone now. Still taking plaquenil. She denies new concerns.  PMH, [facility], social history reviewed and updated.   Review of Systems  Constitutional: Negative.   HENT: Negative.   Eyes: Negative.   Respiratory: Negative for cough, chest tightness and shortness of breath.   Cardiovascular: Negative for chest pain, palpitations and leg swelling.  Gastrointestinal: Negative for abdominal distention, abdominal pain, constipation, diarrhea, nausea and vomiting.  Musculoskeletal: Negative.   Skin: Negative.   Neurological: Negative.   Psychiatric/Behavioral: Negative.       Objective:   Physical Exam  Constitutional: She is oriented to person, place, and time. She appears well-developed and well-nourished.  HENT:  Head: Normocephalic and atraumatic.  Eyes: EOM are normal.  Neck: Normal range of motion.  Cardiovascular: Normal rate and regular rhythm.  Pulmonary/Chest: Effort normal and breath sounds normal. No respiratory distress. She has no wheezes. She has no rales.  Abdominal: Soft. Bowel sounds are normal. She exhibits no distension. There is no tenderness. There is no rebound.  Musculoskeletal: She exhibits no edema.  Neurological: She is alert and oriented to person, place, and time. Coordination normal.  Skin: Skin is warm and dry.  Psychiatric: She has a normal mood and affect.   Vitals:   04/05/18 1533  BP: 110/70  Pulse: 91  Temp: 98.6 F (37 C)  TempSrc: Oral  SpO2: 98%  Weight: 173 lb (78.5 kg)  Height: 5\' 6"  (1.676 m)      Assessment & Plan:

## 2018-04-06 IMAGING — RF DG LUMBAR SPINE 2-3V
1 series · 3 of 3 positions shown · non-contrast
Comparison: Lumbar MRI 07/27/2017

CLINICAL DATA: Elective surgery.  L5-S1 ALIF

EXAM:
LUMBAR SPINE - 2-3 VIEW; DG C-ARM 61-120 MIN

[Series 1: run · 3 of 3 slices shown]
[im 1/3]
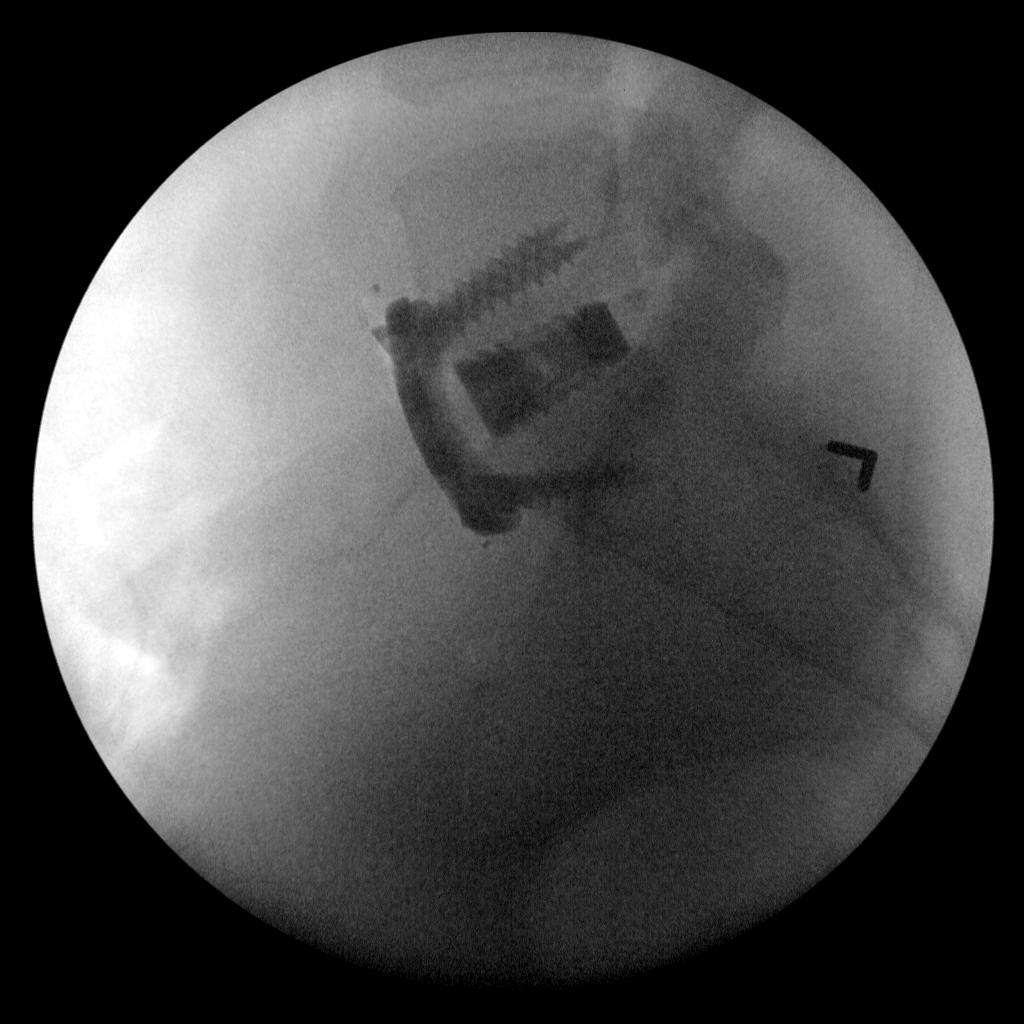
[im 2/3]
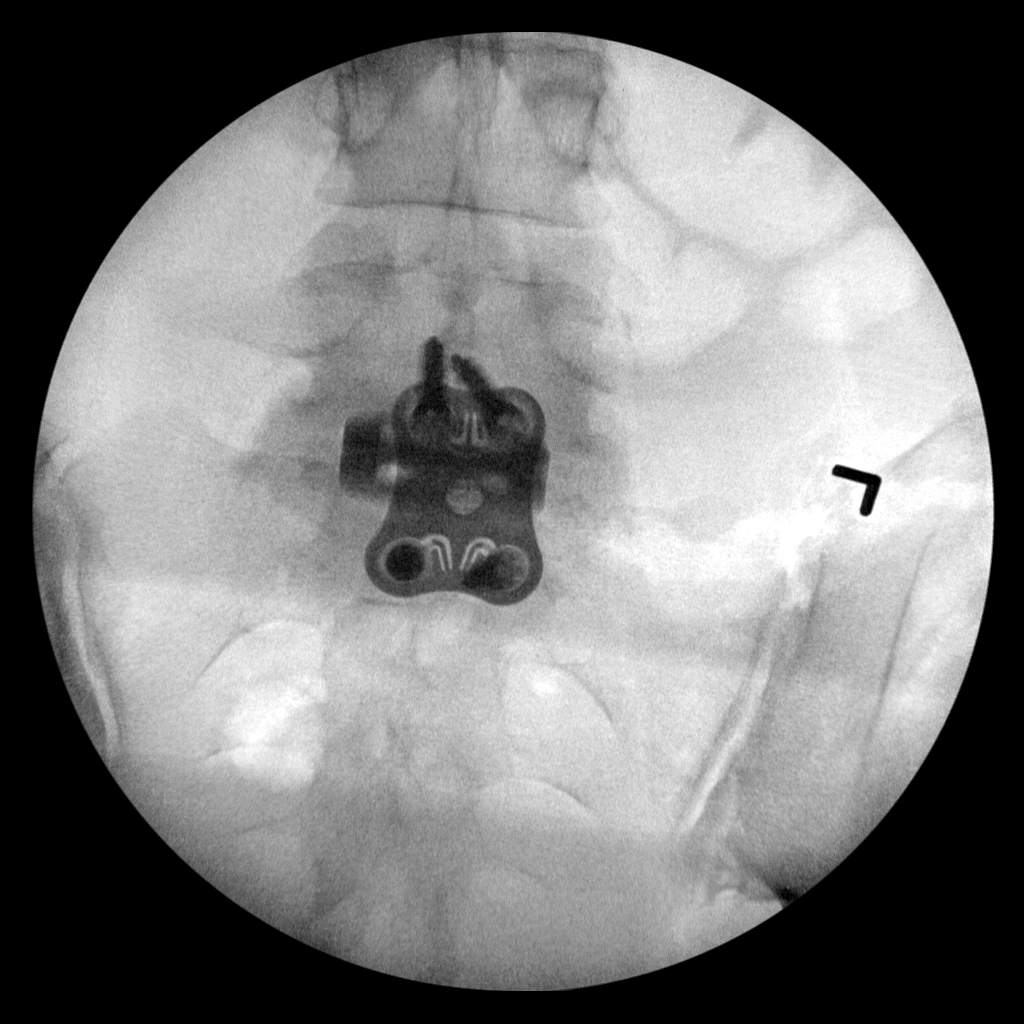
[im 3/3]
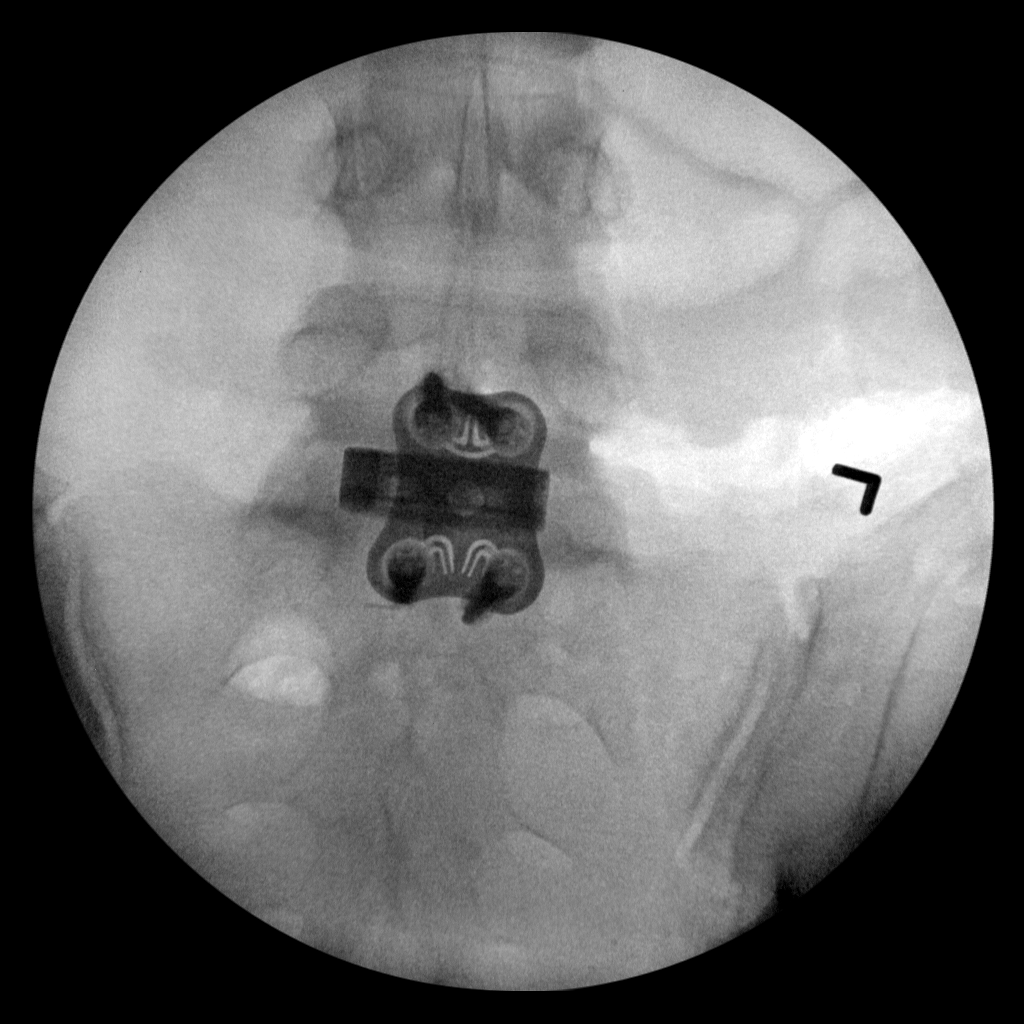

[3 of 3 positions shown; findings below may reference images not displayed]

FINDINGS: Changes of ALIF at L5-S1 with ventral plate. Hardware is in expected
position. No evidence of fracture.
IMPRESSION: Fluoroscopy for L5-S1 ALIF.  No unexpected finding.

## 2018-04-06 NOTE — Assessment & Plan Note (Signed)
Seeing rheumatology and off prednisone currently on plaquenil.

## 2018-04-07 DIAGNOSIS — Z0000 Encounter for general adult medical examination without abnormal findings: Secondary | ICD-10-CM | POA: Insufficient documentation

## 2018-04-07 DIAGNOSIS — F331 Major depressive disorder, recurrent, moderate: Secondary | ICD-10-CM | POA: Diagnosis not present

## 2018-04-07 NOTE — Assessment & Plan Note (Signed)
Pap smear with gyn. Declines tetanus today. Counseled about sun safety and mole surveillance. Given screening recommendations.

## 2018-04-07 NOTE — Assessment & Plan Note (Signed)
Refill flexeril today, okay with tramadol 1 at night although she is going to try to wean off given she is thinking about children in the near future.

## 2018-04-08 ENCOUNTER — Other Ambulatory Visit: Payer: Self-pay | Admitting: Internal Medicine

## 2018-04-08 MED ORDER — TRAMADOL HCL 50 MG PO TABS: 50.0000 mg | ORAL_TABLET | Freq: Every evening | ORAL | 3 refills | Status: DC | PRN

## 2018-04-08 NOTE — Telephone Encounter (Signed)
Rx refill request: tramadol 50 mg    Last filled: 03/18/18     Historical provider  LOV: 04/05/18  PCP: [place]: verified

## 2018-04-08 NOTE — Telephone Encounter (Signed)
Copied from [place] [id number]. Topic: Quick Communication - Rx Refill/Question >> Apr 08, 2018 12:57 PM Bea Graff, NT wrote: Medication: traMADol (ULTRAM) 50 MG tablet   Has the patient contacted their pharmacy? Yes.   (Agent: If no, request that the patient contact the pharmacy for the refill.) (Agent: If yes, when and what did the pharmacy advise?)  Preferred Pharmacy (with phone number or street name): [facility] #[id number] - [place], [address] [phone] (Phone) [phone] (Fax)    Agent: Please be advised that RX refills may take up to 3 business days. We ask that you follow-up with your pharmacy.

## 2018-04-14 DIAGNOSIS — Z79891 Long term (current) use of opiate analgesic: Secondary | ICD-10-CM | POA: Diagnosis not present

## 2018-04-18 DIAGNOSIS — F4310 Post-traumatic stress disorder, unspecified: Secondary | ICD-10-CM | POA: Diagnosis not present

## 2018-04-27 DIAGNOSIS — F4310 Post-traumatic stress disorder, unspecified: Secondary | ICD-10-CM | POA: Diagnosis not present

## 2018-05-12 DIAGNOSIS — F4310 Post-traumatic stress disorder, unspecified: Secondary | ICD-10-CM | POA: Diagnosis not present

## 2018-05-17 DIAGNOSIS — Z79891 Long term (current) use of opiate analgesic: Secondary | ICD-10-CM | POA: Diagnosis not present

## 2018-05-18 DIAGNOSIS — F4310 Post-traumatic stress disorder, unspecified: Secondary | ICD-10-CM | POA: Diagnosis not present

## 2018-05-19 DIAGNOSIS — F4310 Post-traumatic stress disorder, unspecified: Secondary | ICD-10-CM | POA: Diagnosis not present

## 2018-05-27 DIAGNOSIS — F4310 Post-traumatic stress disorder, unspecified: Secondary | ICD-10-CM | POA: Diagnosis not present

## 2018-05-30 DIAGNOSIS — F331 Major depressive disorder, recurrent, moderate: Secondary | ICD-10-CM | POA: Diagnosis not present

## 2018-06-01 ENCOUNTER — Telehealth: Payer: Self-pay | Admitting: Internal Medicine

## 2018-06-01 NOTE — Telephone Encounter (Signed)
Patient states that she has been back at taking her Tramadol 2 a day because she has started back up at work and working out and is running out early patient is wondering if she can get her script changed to what it was when it was prescribed by Dr. Ronnald Ramp which was 3 pills a day. Also wanted to see if she could get a prescription for ibuprofen 800 is currently taking OTC but would like covered by insurance. Patient aware she may need to make an appointment to discuss

## 2018-06-01 NOTE — Telephone Encounter (Signed)
Pt would like to talk with you about the Tramadol she was prescribed at last visit.  Can you please call her

## 2018-06-02 NOTE — Telephone Encounter (Signed)
Can you call patient and see if she would like to make an appointment to discuss what is going on since we cannot increase the tramadol. Thank you

## 2018-06-02 NOTE — Telephone Encounter (Signed)
LVM for patient to call back to make appt to discuss increasing tramadol.

## 2018-06-02 NOTE — Telephone Encounter (Signed)
We cannot increase the tramadol. She is supposed to be taking only rarely.

## 2018-06-03 NOTE — Telephone Encounter (Signed)
Pt has been sch for 06-07-18

## 2018-06-07 ENCOUNTER — Ambulatory Visit: Payer: Self-pay | Admitting: Internal Medicine

## 2018-06-07 DIAGNOSIS — F4310 Post-traumatic stress disorder, unspecified: Secondary | ICD-10-CM | POA: Diagnosis not present

## 2018-06-08 DIAGNOSIS — F331 Major depressive disorder, recurrent, moderate: Secondary | ICD-10-CM | POA: Diagnosis not present

## 2018-06-08 DIAGNOSIS — F4323 Adjustment disorder with mixed anxiety and depressed mood: Secondary | ICD-10-CM | POA: Diagnosis not present

## 2018-06-09 ENCOUNTER — Ambulatory Visit: Payer: Self-pay | Admitting: Internal Medicine

## 2018-06-15 DIAGNOSIS — F4310 Post-traumatic stress disorder, unspecified: Secondary | ICD-10-CM | POA: Diagnosis not present

## 2018-06-16 ENCOUNTER — Encounter: Payer: Self-pay | Admitting: Internal Medicine

## 2018-06-16 ENCOUNTER — Ambulatory Visit: Payer: BLUE CROSS/BLUE SHIELD | Admitting: Internal Medicine

## 2018-06-16 VITALS — BP 110/80 | HR 118 | Temp 98.6°F | Ht 66.0 in | Wt 167.0 lb

## 2018-06-16 DIAGNOSIS — Z981 Arthrodesis status: Secondary | ICD-10-CM

## 2018-06-16 DIAGNOSIS — Z23 Encounter for immunization: Secondary | ICD-10-CM | POA: Diagnosis not present

## 2018-06-16 DIAGNOSIS — Z79891 Long term (current) use of opiate analgesic: Secondary | ICD-10-CM | POA: Diagnosis not present

## 2018-06-16 MED ORDER — TRAMADOL HCL 50 MG PO TABS: 50.0000 mg | ORAL_TABLET | Freq: Two times a day (BID) | ORAL | 5 refills | Status: DC | PRN

## 2018-06-16 NOTE — Patient Instructions (Signed)
We have sent in the new prescription for the tramadol.   We have given you the flu and tetanus shot today.

## 2018-06-16 NOTE — Assessment & Plan Note (Signed)
Will increase the tramadol to BID for convenience so she can be more active. She knows that we would have to talk again if pain worsens and she is needing differently.

## 2018-06-16 NOTE — Progress Notes (Signed)
   Subjective:    Patient ID: Kathleen Ashley, female    DOB: July 14, 1981, 38 y.o.   MRN: [id number]  HPI The patient is a 37 YO female coming in for recurrent back pain. She is working out with a Water engineer and this has helped her and she has lost 10 pounds. But sometimes she has more pain and this stops her from working out. She is using tramadol sometimes twice a day and sometimes not at all. Rarely 3 per day. She is feeling good about being more active and long term this is her goal. She is doing newborn photography and wants to get any immunizations that are needed.   Review of Systems  Constitutional: Negative.   HENT: Negative.   Eyes: Negative.   Respiratory: Negative for cough, chest tightness and shortness of breath.   Cardiovascular: Negative for chest pain, palpitations and leg swelling.  Gastrointestinal: Negative for abdominal distention, abdominal pain, constipation, diarrhea, nausea and vomiting.  Musculoskeletal: Positive for back pain and myalgias.  Skin: Negative.   Neurological: Negative.   Psychiatric/Behavioral: Negative.       Objective:   Physical Exam  Constitutional: She is oriented to person, place, and time. She appears well-developed and well-nourished.  HENT:  Head: Normocephalic and atraumatic.  Eyes: EOM are normal.  Neck: Normal range of motion.  Cardiovascular: Normal rate and regular rhythm.  Pulmonary/Chest: Effort normal and breath sounds normal. No respiratory distress. She has no wheezes. She has no rales.  Abdominal: Soft. Bowel sounds are normal. She exhibits no distension. There is no tenderness. There is no rebound.  Musculoskeletal: She exhibits tenderness. She exhibits no edema.  Neurological: She is alert and oriented to person, place, and time. Coordination normal.  Skin: Skin is warm and dry.   Vitals:   06/16/18 1557  BP: 110/80  Pulse: (!) 118  Temp: 98.6 F (37 C)  TempSrc: Oral  SpO2: 98%  Weight: 167 lb (75.8 kg)  Height:  5\' 6"  (1.676 m)      Assessment & Plan:  Flu and tdap given at visit.

## 2018-07-01 DIAGNOSIS — F4310 Post-traumatic stress disorder, unspecified: Secondary | ICD-10-CM | POA: Diagnosis not present

## 2018-07-05 ENCOUNTER — Ambulatory Visit: Payer: Self-pay | Admitting: *Deleted

## 2018-07-05 NOTE — Telephone Encounter (Signed)
Pt reports index, middle and pinkie finger of right hand were "Whitish from tip of finger to knuckles yesterday." States "Ran warm water over hand and symptoms resolved." States this am same fingers are purplish at tips with a "Burning sensation." Pt states has h/o Lupus and states this has happened before "But didn't last as long." Pt also reports has been decorating and using fingers/hands a lot. States has taken Motrin and keeping fingers warm which has helped "Some." Pt is out of town until after Thanksgiving. Asking for advise from Dr. Sharlet Salina. TN suggested she alert her Rheumatologist; states she will do so. Informed pt I would alert Dr. Sharlet Salina per her request. Asking if Dr. Sharlet Salina has any recommendations. Directed pt to ED if symptoms worsen, continue. Please advise: [phone] Reason for Disposition . [1] Finger pain or color changes occurs after exposure to cold AND [2] is a recurrent problem    Recurrent symptom, H/O Lupus.  Answer Assessment - Initial Assessment Questions 1. ONSET: "When did the pain start?"      This am; fingers were "Whitish" yesterday, no pain; burning sensation today 2. LOCATION and RADIATION: "Where is the pain located?"  (e.g., fingertip, around nail, joint, entire  finger)      Finger tips of index, middle and pinkie fingers of right hand, "Burning sensation. 3. SEVERITY: "How bad is the pain?" "What does it keep you from doing?"   (Scale 1-10; or mild, moderate, severe)  - MILD - doesn't interfere with normal activities   - MODERATE - interferes with normal activities or awakens from sleep  - SEVERE - excruciating pain, unable to hold a glass of water or bend finger even a little     moderate 4. APPEARANCE: "What does the finger look like?" (e.g., redness, swelling, bruising, pallor)     Today finger tips are purplish, "Better after running under warm water." 5. WORK OR EXERCISE: "Has there been any recent work or exercise that involved this part of the  body?"     "Have been decorating a lot, using my hands a lot." 6. CAUSE: "What do you think is causing the pain?"     "Lupus" 7. AGGRAVATING FACTORS: "What makes the pain worse?" (e.g., using computer)      8. OTHER SYMPTOMS: "Do you have any other symptoms?" (e.g., fever, neck pain, numbness)     no 9. PREGNANCY: "Is there any chance you are pregnant?" "When was your last menstrual period?"     no  Protocols used: FINGER PAIN-A-AH

## 2018-07-06 NOTE — Telephone Encounter (Signed)
LVM with MD response  

## 2018-07-06 NOTE — Telephone Encounter (Signed)
Keep hands warm may help some.

## 2018-07-12 ENCOUNTER — Telehealth: Payer: Self-pay | Admitting: Internal Medicine

## 2018-07-12 NOTE — Telephone Encounter (Signed)
Patient called and would also like her husband to be tested , [facility]  5/.16/1990

## 2018-07-12 NOTE — Telephone Encounter (Signed)
Copied from [place] [id number]. Topic: Quick Communication - See Telephone Encounter >> Jul 12, 2018  4:21 PM Cecelia Byars, NT wrote: CRM for notification. See Telephone encounter for: 07/12/18. Patient called and states she has just found out a family member tested positive for hep b ,and she would like for her an her husband to be tested as soon as possible ,

## 2018-07-12 NOTE — Telephone Encounter (Signed)
Spoke with patient, &Informed herself &husband would need an appointment.   Appointments have been made for 11/21.

## 2018-07-13 DIAGNOSIS — Z79891 Long term (current) use of opiate analgesic: Secondary | ICD-10-CM | POA: Diagnosis not present

## 2018-07-14 ENCOUNTER — Other Ambulatory Visit: Payer: BLUE CROSS/BLUE SHIELD

## 2018-07-14 ENCOUNTER — Ambulatory Visit: Payer: BLUE CROSS/BLUE SHIELD | Admitting: Internal Medicine

## 2018-07-14 ENCOUNTER — Encounter: Payer: Self-pay | Admitting: Internal Medicine

## 2018-07-14 VITALS — BP 100/60 | HR 80 | Temp 98.1°F | Ht 66.0 in | Wt 166.0 lb

## 2018-07-14 DIAGNOSIS — Z205 Contact with and (suspected) exposure to viral hepatitis: Secondary | ICD-10-CM

## 2018-07-14 NOTE — Progress Notes (Signed)
   Subjective:    Patient ID: Kathleen Ashley, female    DOB: 18-Jun-1981, 37 y.o.   MRN: [id number]  HPI The patient is a 37 YO female coming in for exposure to hepatitis B. She has a family member that has recently been diagnosed with hepatitis B that she spends a lot of time with. She also cooks them a lot of things. She denies fatigue, fevers, chills, abdominal pain, diarrhea. Thinks she got normal childhood vaccines.   Review of Systems  Constitutional: Negative.   HENT: Negative.   Eyes: Negative.   Respiratory: Negative for cough, chest tightness and shortness of breath.   Cardiovascular: Negative for chest pain, palpitations and leg swelling.  Gastrointestinal: Negative for abdominal distention, abdominal pain, constipation, diarrhea, nausea and vomiting.  Musculoskeletal: Positive for back pain.  Skin: Negative.   Neurological: Negative.   Psychiatric/Behavioral: Negative.       Objective:   Physical Exam  Constitutional: She is oriented to person, place, and time. She appears well-developed and well-nourished.  HENT:  Head: Normocephalic and atraumatic.  Eyes: EOM are normal.  Neck: Normal range of motion.  Cardiovascular: Normal rate and regular rhythm.  Pulmonary/Chest: Effort normal and breath sounds normal. No respiratory distress. She has no wheezes. She has no rales.  Abdominal: Soft. Bowel sounds are normal. She exhibits no distension. There is no tenderness. There is no rebound.  Musculoskeletal: She exhibits tenderness. She exhibits no edema.  Neurological: She is alert and oriented to person, place, and time. Coordination normal.  Skin: Skin is warm and dry.   Vitals:   07/14/18 0843  BP: 100/60  Pulse: 80  Temp: 98.1 F (36.7 C)  TempSrc: Oral  SpO2: 98%  Weight: 166 lb (75.3 kg)  Height: 5\' 6"  (1.676 m)      Assessment & Plan:

## 2018-07-14 NOTE — Assessment & Plan Note (Addendum)
Checking for immunity and infection for hep b and treat as appropriate.

## 2018-07-15 LAB — HEPATITIS B SURFACE ANTIBODY,QUALITATIVE: Hep B S Ab: REACTIVE — AB

## 2018-07-15 LAB — HEPATITIS B SURFACE ANTIGEN: HEP B S AG: NONREACTIVE

## 2018-07-20 ENCOUNTER — Encounter: Payer: Self-pay | Admitting: Internal Medicine

## 2018-07-20 NOTE — Progress Notes (Signed)
Abstracted and sent to scan  

## 2018-07-26 ENCOUNTER — Telehealth: Payer: Self-pay | Admitting: Internal Medicine

## 2018-07-26 NOTE — Telephone Encounter (Signed)
Copied from [place] [id number]. Topic: Quick Communication - Rx Refill/Question >> Jul 26, 2018  3:57 PM Sheppard Coil, [employer] wrote: Medication: Prednisone 5mg  takes one a day  Pt states that she gets this through her rheumatologist (Dr. Dossie Der).  Pt states her next OV with that doctor is 01/12 and they will not refill.  Pt wants to know if PCP will refill until they can see that doctor.  Has the patient contacted their pharmacy? no (Agent: If no, request that the patient contact the pharmacy for the refill.) (Agent: If yes, when and what did the pharmacy advise?)  Preferred Pharmacy (with phone number or street name): [facility] #[id number] - [place], [address] AT [place] [phone] (Phone) [phone] (Fax)  Agent: Please be advised that RX refills may take up to 3 business days. We ask that you follow-up with your pharmacy.

## 2018-07-26 NOTE — Telephone Encounter (Signed)
Called patient for clarification. / Per patient the Rheumatologist will not refill prednisone until her next appointment which is 09-04-2018 /  Patient states she has Lupus and is getting a flare up and she needs to have prednisone. / She normally takes 5mg  Prednisone daily, once she starts feeling better she tapers herself off. / Patient is stating that she was told to call her PCP to get a short supply until her appointment.

## 2018-07-27 MED ORDER — PREDNISONE 10 MG PO TABS: ORAL_TABLET | ORAL | 0 refills | Status: DC

## 2018-07-27 NOTE — Telephone Encounter (Signed)
LVM informing patient of MD response  

## 2018-07-27 NOTE — Telephone Encounter (Signed)
Sent in prednisone for her. If no relief needs visit.

## 2018-08-04 DIAGNOSIS — Z79891 Long term (current) use of opiate analgesic: Secondary | ICD-10-CM | POA: Diagnosis not present

## 2018-08-05 DIAGNOSIS — F4310 Post-traumatic stress disorder, unspecified: Secondary | ICD-10-CM | POA: Diagnosis not present

## 2018-08-10 ENCOUNTER — Other Ambulatory Visit: Payer: Self-pay | Admitting: Internal Medicine

## 2018-08-10 NOTE — Telephone Encounter (Signed)
Copied from [place] [id number]. Topic: Quick Communication - Rx Refill/Question >> Aug 10, 2018  2:23 PM Yvette Rack wrote: Medication: traMADol (ULTRAM) 50 MG tablet Pt is leaving to go out of town today   Has the patient contacted their pharmacy? Yes.   (Agent: If no, request that the patient contact the pharmacy for the refill.) (Agent: If yes, when and what did the pharmacy advise?)pt call them today  Preferred Pharmacy (with phone number or street name):     [facility] #[id number] - [place], [address] AT [place] [phone] (Phone) [phone] (Fax)    Agent: Please be advised that RX refills may take up to 3 business days. We ask that you follow-up with your pharmacy.

## 2018-08-10 NOTE — Telephone Encounter (Signed)
Call to pharmacy- patient needs to pick up Rx 1 day early- they need permission to release. Please call them to let them know if this is OK.

## 2018-08-11 NOTE — Telephone Encounter (Signed)
Pharmacy informed.

## 2018-08-11 NOTE — Telephone Encounter (Signed)
Okay to call pharmacy okay for early fill but this should not be a refill encounter if she does not need refill. Should be phone call.

## 2018-08-25 ENCOUNTER — Other Ambulatory Visit: Payer: Self-pay | Admitting: *Deleted

## 2018-08-25 NOTE — Telephone Encounter (Signed)
  Message from [facility] sent at 08/25/2018 3:09 PM EST   Summary: Medication request    Patient is calling asking about the recent medication she picked up today cyclobenzaprine (FLEXERIL) 5 MG tablet, she is asking about how she is supposed to be taking the medication She states that you can leave info on voicemail if needed.            Left message on pt's voicemail regarding instructions for how to take Flexeril. Last prescription was written on 04/05/18 #90 with 3 refills.Pt advised to contact office for additional questions.

## 2018-08-25 NOTE — Telephone Encounter (Signed)
Pt returning call to office and states that she has been taking Flexeril 5mg - 2 tabs every night. Pt states on her last refill she was only given 30 days which was last filed on 08/24/18. Last prescription on file was noted to be sent to pharmacy on 04/05/18 #90 with 3 refills. Pt asking if she could have a refill of Flexeril after the 15 day supply she was recently dispensed. Pt also scheduled for appt on 08/29/18 with Dr. Sharlet Salina to discuss other options for medications that could help with her muscle spasms and with sleeping.

## 2018-08-25 NOTE — Addendum Note (Signed)
Addended by: Dimple Nanas on: 08/25/2018 04:29 PM   Modules accepted: Orders

## 2018-08-26 MED ORDER — CYCLOBENZAPRINE HCL 5 MG PO TABS: 5.0000 mg | ORAL_TABLET | Freq: Two times a day (BID) | ORAL | 3 refills | Status: DC | PRN

## 2018-08-30 DIAGNOSIS — M329 Systemic lupus erythematosus, unspecified: Secondary | ICD-10-CM | POA: Diagnosis not present

## 2018-08-30 DIAGNOSIS — M549 Dorsalgia, unspecified: Secondary | ICD-10-CM | POA: Diagnosis not present

## 2018-08-30 DIAGNOSIS — M2550 Pain in unspecified joint: Secondary | ICD-10-CM | POA: Diagnosis not present

## 2018-08-30 DIAGNOSIS — R5383 Other fatigue: Secondary | ICD-10-CM | POA: Diagnosis not present

## 2018-09-02 ENCOUNTER — Encounter: Payer: Self-pay | Admitting: Internal Medicine

## 2018-09-02 ENCOUNTER — Ambulatory Visit (INDEPENDENT_AMBULATORY_CARE_PROVIDER_SITE_OTHER): Payer: BLUE CROSS/BLUE SHIELD | Admitting: Internal Medicine

## 2018-09-02 DIAGNOSIS — Z981 Arthrodesis status: Secondary | ICD-10-CM

## 2018-09-02 NOTE — Patient Instructions (Signed)
You can slowly come off the flexeril and we have given you a note for the jury duty in case.

## 2018-09-02 NOTE — Progress Notes (Signed)
   Subjective:   Patient ID: Kathleen Ashley, female    DOB: [birth year], 38 y.o.   MRN: [id number]  HPI The patient is a 38 YO female coming in for concerns about her muscle relaxer. She has been taking flexeril TID for some time for pain. Then she had her surgery and was never advised to try coming down. She is wanting to do this and wanted advice for tapering. Denies flare of back pain. Denies injury or overuse.   Review of Systems  Constitutional: Negative for activity change, appetite change, chills, fatigue, fever and unexpected weight change.  Respiratory: Negative.   Cardiovascular: Negative.   Gastrointestinal: Negative.   Musculoskeletal: Positive for back pain and myalgias. Negative for arthralgias, gait problem and joint swelling.  Skin: Negative.   Neurological: Negative.     Objective:  Physical Exam Constitutional:      Appearance: She is well-developed.  HENT:     Head: Normocephalic and atraumatic.  Neck:     Musculoskeletal: Normal range of motion.  Cardiovascular:     Rate and Rhythm: Normal rate and regular rhythm.  Pulmonary:     Effort: Pulmonary effort is normal. No respiratory distress.     Breath sounds: Normal breath sounds. No wheezing or rales.  Skin:    General: Skin is warm and dry.  Neurological:     Mental Status: She is alert and oriented to person, place, and time.     Coordination: Coordination normal.     Vitals:   09/02/18 1258  BP: 110/70  Pulse: (!) 105  Temp: 98 F (36.7 C)  TempSrc: Oral  SpO2: 97%  Weight: 165 lb (74.8 kg)  Height: 5\' 6"  (1.676 m)    Assessment & Plan:

## 2018-09-02 NOTE — Assessment & Plan Note (Signed)
Advised on decrease in flexeril dosing to see if she can decrease or stop this.

## 2018-09-09 DIAGNOSIS — E259 Adrenogenital disorder, unspecified: Secondary | ICD-10-CM | POA: Diagnosis not present

## 2018-09-12 DIAGNOSIS — F4310 Post-traumatic stress disorder, unspecified: Secondary | ICD-10-CM | POA: Diagnosis not present

## 2018-09-27 DIAGNOSIS — M329 Systemic lupus erythematosus, unspecified: Secondary | ICD-10-CM | POA: Diagnosis not present

## 2018-09-27 LAB — CBC AND DIFFERENTIAL
HCT: 31 — AB (ref 36–46)
Hemoglobin: 9.9 — AB (ref 12.0–16.0)
PLATELETS: 130 — AB (ref 150–399)
WBC: 5.6

## 2018-09-27 LAB — BASIC METABOLIC PANEL
BUN: 8 (ref 4–21)
CREATININE: 0.7 (ref 0.5–1.1)
Glucose: 84
POTASSIUM: 3.3 — AB (ref 3.4–5.3)
SODIUM: 141 (ref 137–147)

## 2018-09-27 LAB — HEPATIC FUNCTION PANEL
ALK PHOS: 72 (ref 25–125)
ALT: 22 (ref 7–35)
AST: 21 (ref 13–35)
Bilirubin, Total: 0.4

## 2018-09-28 DIAGNOSIS — F4310 Post-traumatic stress disorder, unspecified: Secondary | ICD-10-CM | POA: Diagnosis not present

## 2018-09-29 DIAGNOSIS — Z79891 Long term (current) use of opiate analgesic: Secondary | ICD-10-CM | POA: Diagnosis not present

## 2018-10-07 ENCOUNTER — Other Ambulatory Visit: Payer: Self-pay | Admitting: Internal Medicine

## 2018-10-12 ENCOUNTER — Encounter: Payer: Self-pay | Admitting: Internal Medicine

## 2018-10-25 ENCOUNTER — Encounter: Payer: Self-pay | Admitting: Internal Medicine

## 2018-10-25 ENCOUNTER — Ambulatory Visit (INDEPENDENT_AMBULATORY_CARE_PROVIDER_SITE_OTHER): Payer: BLUE CROSS/BLUE SHIELD | Admitting: Internal Medicine

## 2018-10-25 DIAGNOSIS — R197 Diarrhea, unspecified: Secondary | ICD-10-CM

## 2018-10-25 DIAGNOSIS — M3219 Other organ or system involvement in systemic lupus erythematosus: Secondary | ICD-10-CM

## 2018-10-25 DIAGNOSIS — R6889 Other general symptoms and signs: Secondary | ICD-10-CM | POA: Diagnosis not present

## 2018-10-25 LAB — POC INFLUENZA A&B (BINAX/QUICKVUE)
Influenza A, POC: NEGATIVE
Influenza B, POC: NEGATIVE

## 2018-10-25 NOTE — Assessment & Plan Note (Signed)
Asked to double dosing of prednisone from 5 mg daily to 10 mg daily while sick.

## 2018-10-25 NOTE — Progress Notes (Signed)
   Subjective:   Patient ID: Kathleen Ashley, female    DOB: [birth year], 38 y.o.   MRN: [id number]  HPI The patient is a 38 y.o. female coming in for several concerns including recent travel with diarrhea illness (traveled to [place] for 1 week, developed diarrhea after eating some ice on accident, took hidrasec and this helped immediately, she was some dehydrated and pushed some gatorade and extra fluids, some vomiting on flight home and took another hidrasec again with relief of symptoms) as well as cold symptoms (started Saturday with travel home, recent travel to [place], traveled through [place] airport, started about 3-4 days ago, main symptoms are: cough, congestion, drainage, headaches and body aches, denies fevers or chills, overall it is stable to mild worsening, has tried nothing for it). 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  Review of Systems  Constitutional: Positive for activity change, appetite change and chills. Negative for fatigue, fever and unexpected weight change.  HENT: Positive for congestion, postnasal drip, rhinorrhea and sinus pressure. Negative for ear discharge, ear pain, sinus pain, sneezing, sore throat, tinnitus, trouble swallowing and voice change.   Eyes: Negative.   Respiratory: Positive for cough. Negative for chest tightness, shortness of breath and wheezing.   Cardiovascular: Negative.  Negative for chest pain, palpitations and leg swelling.  Gastrointestinal: Negative.  Negative for abdominal distention, abdominal pain, constipation, diarrhea, nausea and vomiting.  Musculoskeletal: Positive for myalgias.  Skin: Negative.   Neurological: Negative.   Psychiatric/Behavioral: Negative.     Objective:  Physical Exam Constitutional:      Appearance: She is well-developed.  HENT:     Head: Normocephalic and atraumatic.     Comments: Oropharynx with redness and clear drainage, nose with swollen  turbinates, TMs normal bilaterally.  Neck:     Musculoskeletal: Normal range of motion.     Thyroid: No thyromegaly.  Cardiovascular:     Rate and Rhythm: Normal rate and regular rhythm.  Pulmonary:     Effort: Pulmonary effort is normal. No respiratory distress.     Breath sounds: Normal breath sounds. No wheezing or rales.  Abdominal:     General: Bowel sounds are normal. There is no distension.     Palpations: Abdomen is soft.     Tenderness: There is no abdominal tenderness. There is no rebound.  Musculoskeletal:        General: Tenderness present.  Lymphadenopathy:     Cervical: No cervical adenopathy.  Skin:    General: Skin is warm and dry.  Neurological:     Mental Status: She is alert and oriented to person, place, and time.     Coordination: Coordination normal.     Vitals:   10/25/18 0956  BP: 130/80  Pulse: 100  Temp: 98.4 F (36.9 C)  TempSrc: Oral  SpO2: 99%  Weight: 165 lb (74.8 kg)  Height: 5\' 6"  (1.676 m)    Assessment & Plan:

## 2018-10-25 NOTE — Assessment & Plan Note (Addendum)
Concern from patient about coronavirus. Flu testing done which was negative. Advised zyrtec. She is somewhat immune compromised with chronic prednisone and plaquenil. If not improved in 1 week needs re-evaluation.

## 2018-10-25 NOTE — Assessment & Plan Note (Signed)
Continue to push fluids and given information about brat diet.

## 2018-10-25 NOTE — Patient Instructions (Addendum)
Your flu testing is negative. You can take zyrtec (cetirizine) over the counter for the congestion. The average virus lasts 7-10 days but you feel the worst around day 4-5.   Keep pushing fluids.   Double the prednisone dose for the next week to cover it.   Food Choices to Help Relieve Diarrhea, Adult When you have diarrhea, the foods you eat and your eating habits are very important. Choosing the right foods and drinks can help:  Relieve diarrhea.  Replace lost fluids and nutrients.  Prevent dehydration. What general guidelines should I follow?  Relieving diarrhea  Choose foods with less than 2 g or .07 oz. of fiber per serving.  Limit fats to less than 8 tsp (38 g or 1.34 oz.) a day.  Avoid the following: ? Foods and beverages sweetened with high-fructose corn syrup, honey, or sugar alcohols such as xylitol, sorbitol, and mannitol. ? Foods that contain a lot of fat or sugar. ? Fried, greasy, or spicy foods. ? High-fiber grains, breads, and cereals. ? Raw fruits and vegetables.  Eat foods that are rich in probiotics. These foods include dairy products such as yogurt and fermented milk products. They help increase healthy bacteria in the stomach and intestines (gastrointestinal tract, or GI tract).  If you have lactose intolerance, avoid dairy products. These may make your diarrhea worse.  Take medicine to help stop diarrhea (antidiarrheal medicine) only as told by your health care provider. Replacing nutrients  Eat small meals or snacks every 3-4 hours.  Eat bland foods, such as white rice, toast, or baked potato, until your diarrhea starts to get better. Gradually reintroduce nutrient-rich foods as tolerated or as told by your health care provider. This includes: ? Well-cooked protein foods. ? Peeled, seeded, and soft-cooked fruits and vegetables. ? Low-fat dairy products.  Take vitamin and mineral supplements as told by your health care provider. Preventing  dehydration  Start by sipping water or a special solution to prevent dehydration (oral rehydration solution, ORS). Urine that is clear or pale yellow means that you are getting enough fluid.  Try to drink at least 8-10 cups of fluid each day to help replace lost fluids.  You may add other liquids in addition to water, such as clear juice or decaffeinated sports drinks, as tolerated or as told by your health care provider.  Avoid drinks with caffeine, such as coffee, tea, or soft drinks.  Avoid alcohol. What foods are recommended?     The items listed may not be a complete list. Talk with your health care provider about what dietary choices are best for you. Grains White rice. White, [place], or pita breads (fresh or toasted), including plain rolls, buns, or bagels. White pasta. Saltine, soda, or graham crackers. Pretzels. Low-fiber cereal. Cooked cereals made with water (such as cornmeal, farina, or cream cereals). Plain muffins. Matzo. Melba toast. Zwieback. Vegetables Potatoes (without the skin). Most well-cooked and canned vegetables without skins or seeds. Tender lettuce. Fruits Apple sauce. Fruits canned in juice. Cooked apricots, cherries, grapefruit, peaches, pears, or plums. Fresh bananas and cantaloupe. Meats and other protein foods Baked or boiled chicken. Eggs. Tofu. Fish. Seafood. Smooth nut butters. Ground or well-cooked tender beef, ham, veal, lamb, pork, or poultry. Dairy Plain yogurt, kefir, and unsweetened liquid yogurt. Lactose-free milk, buttermilk, skim milk, or soy milk. Low-fat or nonfat hard cheese. Beverages Water. Low-calorie sports drinks. Fruit juices without pulp. Strained tomato and vegetable juices. Decaffeinated teas. Sugar-free beverages not sweetened with sugar alcohols. Oral rehydration  solutions, if approved by your health care provider. Seasoning and other foods Bouillon, broth, or soups made from recommended foods. What foods are not recommended? The  items listed may not be a complete list. Talk with your health care provider about what dietary choices are best for you. Grains Whole grain, whole wheat, bran, or rye breads, rolls, pastas, and crackers. Wild or brown rice. Whole grain or bran cereals. Barley. Oats and oatmeal. Corn tortillas or taco shells. Granola. Popcorn. Vegetables Raw vegetables. Fried vegetables. Cabbage, broccoli, Brussels sprouts, artichokes, baked beans, beet greens, corn, kale, legumes, peas, sweet potatoes, and yams. Potato skins. Cooked spinach and cabbage. Fruits Dried fruit, including raisins and dates. Raw fruits. Stewed or dried prunes. Canned fruits with syrup. Meat and other protein foods Fried or fatty meats. Deli meats. Chunky nut butters. Nuts and seeds. Beans and lentils. Berniece Salines. Hot dogs. Sausage. Dairy High-fat cheeses. Whole milk, chocolate milk, and beverages made with milk, such as milk shakes. Half-and-half. Cream. sour cream. Ice cream. Beverages Caffeinated beverages (such as coffee, tea, soda, or energy drinks). Alcoholic beverages. Fruit juices with pulp. Prune juice. Soft drinks sweetened with high-fructose corn syrup or sugar alcohols. High-calorie sports drinks. Fats and oils Butter. Cream sauces. Margarine. Salad oils. Plain salad dressings. Olives. Avocados. Mayonnaise. Sweets and desserts Sweet rolls, doughnuts, and sweet breads. Sugar-free desserts sweetened with sugar alcohols such as xylitol and sorbitol. Seasoning and other foods Honey. Hot sauce. Chili powder. Gravy. Cream-based or milk-based soups. Pancakes and waffles. Summary  When you have diarrhea, the foods you eat and your eating habits are very important.  Make sure you get at least 8-10 cups of fluid each day, or enough to keep your urine clear or pale yellow.  Eat bland foods and gradually reintroduce healthy, nutrient-rich foods as tolerated, or as told by your health care provider.  Avoid high-fiber, fried, greasy, or  spicy foods. This information is not intended to replace advice given to you by your health care provider. Make sure you discuss any questions you have with your health care provider. Document Released: 10/31/2003 Document Revised: 08/07/2016 Document Reviewed: 08/07/2016 Elsevier Interactive Patient Education  2019 [employer].

## 2018-10-26 ENCOUNTER — Telehealth: Payer: Self-pay | Admitting: Internal Medicine

## 2018-10-26 NOTE — Telephone Encounter (Signed)
Called patient regarding request for prednisone 10 mg tab. She stated that Dr. Sharlet Salina had said for her to call and request it if she needs it.  Per pt, she stated her rheumatologist had doubled up on this medication so she is requesting it now. This med is not listed on her med list. So sending it back to her provide for review. Pt voiced understanding.

## 2018-10-26 NOTE — Telephone Encounter (Signed)
Copied from [place] [id number]. Topic: Quick Communication - Rx Refill/Question >> Oct 26, 2018  1:21 PM Colvin Caroli N wrote: Medication: predniSONE (DELTASONE) 10 MG tablet  Patient is requesting refill of this medication.   Preferred Pharmacy (with phone number or street name):[id number] - [place], [address] AT [facility] [address] [phone] (Phone) [phone] (Fax)

## 2018-10-27 MED ORDER — PREDNISONE 5 MG PO TABS: ORAL_TABLET | ORAL | 0 refills | Status: DC

## 2018-10-27 NOTE — Telephone Encounter (Signed)
Sent in to [facility].

## 2018-10-28 ENCOUNTER — Ambulatory Visit: Payer: Self-pay | Admitting: *Deleted

## 2018-10-28 NOTE — Telephone Encounter (Signed)
Patient called to say that since seeing the doctor on 10/25/2018 she feels worst is taking all her medication as prescribed. Said that she cant sleep because she is so congested and is coughing all night. Please advise. Ph# [phone]  Patient states she is having problems with her cough at night - she is taking mucinex, OTC multi-symptom, tylenol. Patient states she does fairly well during the day- but at night she is having trouble sleeping because of the cough. Reviewed use of honey, nettie pot, humidifier, increasing fluids. Patient will call office if her symptoms increase- will send call for review of possible medication to help patient with cough- encouraged patient to come back to office for fever, chest pain, cough that does not get better, sinus pain.   Reason for Disposition . Cough  Answer Assessment - Initial Assessment Questions 1. ONSET: "When did the cough begin?"      Started Saturday- got worse-throughout the week 2. SEVERITY: "How bad is the cough today?"      Worse at night 3. RESPIRATORY DISTRESS: "Describe your breathing."      Patient is blowing her nose sonstantly 4. FEVER: "Do you have a fever?" If so, ask: "What is your temperature, how was it measured, and when did it start?"     no 5. SPUTUM: "Describe the color of your sputum" (clear, white, yellow, green)     Orange/green 6. HEMOPTYSIS: "Are you coughing up any blood?" If so ask: "How much?" (flecks, streaks, tablespoons, etc.)     no 7. CARDIAC HISTORY: "Do you have any history of heart disease?" (e.g., heart attack, congestive heart failure)      no 8. LUNG HISTORY: "Do you have any history of lung disease?"  (e.g., pulmonary embolus, asthma, emphysema)     no 9. PE RISK FACTORS: "Do you have a history of blood clots?" (or: recent major surgery, recent prolonged travel, bedridden)     no 10. OTHER SYMPTOMS: "Do you have any other symptoms?" (e.g., runny nose, wheezing, chest pain)       Congestion, chest  pain, pressure in head 11. PREGNANCY: "Is there any chance you are pregnant?" "When was your last menstrual period?"       No- LMP- 2/29 12. TRAVEL: "Have you traveled out of the country in the last month?" (e.g., travel history, exposures)       [place]- 2/22-2/29  Protocols used: [place]

## 2018-11-02 ENCOUNTER — Other Ambulatory Visit: Payer: Self-pay

## 2018-11-02 DIAGNOSIS — I10 Essential (primary) hypertension: Secondary | ICD-10-CM | POA: Diagnosis not present

## 2018-11-02 DIAGNOSIS — R410 Disorientation, unspecified: Secondary | ICD-10-CM | POA: Diagnosis not present

## 2018-11-02 DIAGNOSIS — R42 Dizziness and giddiness: Secondary | ICD-10-CM | POA: Diagnosis not present

## 2018-11-02 DIAGNOSIS — R569 Unspecified convulsions: Secondary | ICD-10-CM | POA: Diagnosis not present

## 2018-11-03 ENCOUNTER — Encounter (HOSPITAL_COMMUNITY): Payer: Self-pay | Admitting: Emergency Medicine

## 2018-11-03 ENCOUNTER — Emergency Department (HOSPITAL_COMMUNITY)
Admission: EM | Admit: 2018-11-03 | Discharge: 2018-11-03 | Disposition: A | Discharge status: Home / Self Care | Payer: BLUE CROSS/BLUE SHIELD | Attending: Emergency Medicine | Admitting: Emergency Medicine

## 2018-11-03 ENCOUNTER — Emergency Department (HOSPITAL_COMMUNITY): Payer: BLUE CROSS/BLUE SHIELD

## 2018-11-03 ENCOUNTER — Other Ambulatory Visit: Payer: Self-pay

## 2018-11-03 DIAGNOSIS — M3210 Systemic lupus erythematosus, organ or system involvement unspecified: Secondary | ICD-10-CM | POA: Diagnosis not present

## 2018-11-03 DIAGNOSIS — Z79899 Other long term (current) drug therapy: Secondary | ICD-10-CM | POA: Diagnosis not present

## 2018-11-03 DIAGNOSIS — R569 Unspecified convulsions: Secondary | ICD-10-CM | POA: Diagnosis not present

## 2018-11-03 LAB — CBG MONITORING, ED: Glucose-Capillary: 112 mg/dL — ABNORMAL HIGH (ref 70–99)

## 2018-11-03 LAB — BASIC METABOLIC PANEL
Anion gap: 12 (ref 5–15)
BUN: 6 mg/dL (ref 6–20)
CALCIUM: 8.8 mg/dL — AB (ref 8.9–10.3)
CO2: 20 mmol/L — ABNORMAL LOW (ref 22–32)
Chloride: 100 mmol/L (ref 98–111)
Creatinine, Ser: 0.62 mg/dL (ref 0.44–1.00)
GFR calc non Af Amer: >60 mL/min (ref >60)
Glucose, Bld: 123 mg/dL — ABNORMAL HIGH (ref 70–99)
Potassium: 3.3 mmol/L — ABNORMAL LOW (ref 3.5–5.1)
SODIUM: 132 mmol/L — AB (ref 135–145)

## 2018-11-03 LAB — CBC
HCT: 33.5 % — ABNORMAL LOW (ref 36.0–46.0)
Hemoglobin: 10.3 g/dL — ABNORMAL LOW (ref 12.0–15.0)
MCH: 21.1 pg — ABNORMAL LOW (ref 26.0–34.0)
MCHC: 30.7 g/dL (ref 30.0–36.0)
MCV: 68.8 fL — ABNORMAL LOW (ref 80.0–100.0)
NRBC: 0 % (ref 0.0–0.2)
Platelets: 113 10*3/uL — ABNORMAL LOW (ref 150–400)
RBC: 4.87 MIL/uL (ref 3.87–5.11)
RDW: 17.1 % — ABNORMAL HIGH (ref 11.5–15.5)
WBC: 7.2 10*3/uL (ref 4.0–10.5)

## 2018-11-03 LAB — I-STAT BETA HCG BLOOD, ED (MC, WL, AP ONLY): I-stat hCG, quantitative: <5 m[IU]/mL (ref <5)

## 2018-11-03 MED ORDER — GADOBUTROL 1 MMOL/ML IV SOLN
7.5000 mL | Freq: Once | INTRAVENOUS | Status: AC | PRN
  Administered 2018-11-03: 7.5 mL via INTRAVENOUS

## 2018-11-03 NOTE — ED Provider Notes (Signed)
ED ECG REPORT   Date: 11/03/2018 0011  Rate: 85  Rhythm: normal sinus rhythm  QRS Axis: normal  Intervals: normal  ST/T Wave abnormalities: nonspecific T wave changes  Conduction Disutrbances:none   I have personally reviewed the EKG tracing and agree with the computerized printout as noted.    Ripley Fraise, MD 11/03/18 [phone]

## 2018-11-03 NOTE — ED Provider Notes (Signed)
Care assumed from [place], please see her note for full details, but in brief  Zosia Lucchese is a 38 y.o. female with a hx of lupus, Started on pred 3 weeks ago for lupus flair who presents after seizure.  Last night had 5 min seizure, witnessed by husband, had been taking multiple cold medications which could lower seizure threshold, no prior hx of seizures.  No focal neurologic deficits on exam.  Labs overall unremarkable without evidence significant electrolyte derangements that would likely cause seizure.  Original care team consulted Dr. Rory Percy, who recommended MR brain w and w/o.  MRI is pending at shift change.  Discussed with patient and family  Plan: Follow-up on MRI, if normal home and opt neuro clinic follow up, if abnormal call neuro back  [place] - Abnormal; Notable for the following components:      Result Value   Sodium 132 (*)    Potassium 3.3 (*)    CO2 20 (*)    Glucose, Bld 123 (*)    Calcium 8.8 (*)    All other components within normal limits  CBC - Abnormal; Notable for the following components:   Hemoglobin 10.3 (*)    HCT 33.5 (*)    MCV 68.8 (*)    MCH 21.1 (*)    RDW 17.1 (*)    Platelets 113 (*)    All other components within normal limits  CBG MONITORING, ED - Abnormal; Notable for the following components:   Glucose-Capillary 112 (*)    All other components within normal limits  I-STAT BETA HCG BLOOD, ED ([facility], [facility], [facility] ONLY)   Mr Jeri Cos And Wo Contrast  Result Date: 11/03/2018 CLINICAL DATA:  History lupus.  Acute presentation with seizure. EXAM: MRI HEAD WITHOUT AND WITH CONTRAST TECHNIQUE: Multiplanar, multiecho pulse sequences of the brain and surrounding structures were obtained without and with intravenous contrast. CONTRAST:  7.5 cc Gadavist COMPARISON:  None. FINDINGS: Brain: The brain has a normal appearance without evidence of malformation, atrophy, old or acute small or large vessel infarction, mass lesion, hemorrhage,  hydrocephalus or extra-axial collection. Mesial temporal lobes are symmetric and normal. After contrast administration, no abnormal enhancement occurs. Vascular: Major vessels at the base of the brain show flow. Venous sinuses appear patent. Skull and upper cervical spine: Normal. Sinuses/Orbits: Mild mucosal inflammatory changes of the paranasal sinuses. Orbits negative. Other: None significant. IMPRESSION: Normal examination.  No cause or sequela of seizure is identified. Electronically Signed   By: Nelson Chimes M.D.   On: 11/03/2018 13:21    MDM  Patient has been monitored in the emergency department for > 10 hours with no further seizure activity.  MRI with and without contrast of the brain shows no evidence of neurologic lupus or other focal findings, overall normal MRI.  Discussed reassuring results with the patient.  At this time she can be discharged home, discussed appropriate seizure precautions, per neurology no antiepileptics indicated at this time.  Patient to follow-up with outpatient neurology.  Return precautions discussed.  Patient expresses understanding and agreement with this plan.  Final diagnoses:  Seizure [facility])      Janet Berlin 11/03/18 1334    Ripley Fraise, MD 11/03/18 2325

## 2018-11-03 NOTE — ED Provider Notes (Signed)
[facility] EMERGENCY DEPARTMENT Provider Note   CSN: [id number] Arrival date & time: 11/03/18  0005    History   Chief Complaint Chief Complaint  Patient presents with  . Seizures    HPI Kathleen Ashley is a 38 y.o. female with a history of systemic lupus erythematous, DDD s/p ALIF L5-S1 who presents to the Emergency Department with a chief complaint of seizure.  The patient's husband reports the patient had a generalized tonic-clonic seizure that lasted for approximately 5 minutes at home.  He said the episode began around midnight.  The patient's husband reports that after the episode ended that she was postictal and does not recall any of the events until EMS arrived within the next 10 minutes.   No history of prior seizure-like activity.  She denies chest pain, fever, chills, numbness, weakness, sore throat, vomiting, diarrhea, palpitations, shortness of breath, facial droop, tinnitus, dizziness, or lightheadedness.  He reports that earlier in the night the patient had taken a dose of NyQuil and Mucinex for cough and congestion after being diagnosed with a viral illness at her PCP.  She tested negative for influenza at her PCPs office.  The patient also reports that she has been treated for a lupus flare with prednisone for the last few weeks.  She also takes Plaquenil.  She reports that her typical lupus flares include arthralgias and myalgias and intermittently she will get a malar rash.        The history is provided by the patient. No language interpreter was used.    Past Medical History:  Diagnosis Date  . DDD (degenerative disc disease)   . Depression   . Low back pain   . Lupus ([place])   . Lupus (systemic lupus erythematosus) ([place])     Patient Active Problem List   Diagnosis Date Noted  . Flu-like symptoms 10/25/2018  . Diarrhea 10/25/2018  . Routine general medical examination at a health care facility 04/07/2018  . S/P lumbar spinal fusion  10/08/2017  . Systemic lupus erythematosus ([place]) 10/20/2016  . Vitamin D deficiency 10/20/2016  . History of gastric bypass 10/20/2016  . Encounter for genetic counseling 02/13/2016    Past Surgical History:  Procedure Laterality Date  . ABDOMINAL EXPOSURE N/A 10/08/2017   Procedure: ABDOMINAL EXPOSURE;  Surgeon: Rosetta Posner, MD;  Location: [facility] OR;  Service: Vascular;  Laterality: N/A;  . abdominal surgery     "tummy tuck"  . ANTERIOR LUMBAR FUSION N/A 10/08/2017   Procedure: Anterior Lumbar Interbody Fusion  - Lumbar five Sacral one;  Surgeon: Eustace Moore, MD;  Location: [facility];  Service: Neurosurgery;  Laterality: N/A;  . BACK SURGERY    . BREAST SURGERY     breast reduction  . GASTRIC BYPASS    . LUMBAR LAMINECTOMY     L5-S1 on the right     OB History    Gravida  1   Para      Term      Preterm      AB  1   Living        SAB      TAB  1   Ectopic      Multiple      Live Births               Home Medications    Prior to Admission medications   Medication Sig Start Date End Date Taking? Authorizing Provider  cyclobenzaprine (FLEXERIL) 5 MG tablet  Take 1 tablet (5 mg total) by mouth 2 (two) times daily as needed for muscle spasms. 08/26/18   Hoyt Koch, MD  hydroxychloroquine (PLAQUENIL) 200 MG tablet Take 400 mg by mouth.     [provider]  ibuprofen (ADVIL,MOTRIN) 800 MG tablet Take 1 tablet (800 mg total) by mouth 3 (three) times daily. Patient taking differently: Take 800 mg by mouth every 8 (eight) hours as needed for headache or moderate pain.  08/31/15   Muthersbaugh, Jarrett Soho, PA-C  Multiple Vitamin (MULTIVITAMIN WITH MINERALS) TABS tablet Take 1 tablet by mouth daily. Reported on 02/11/2016    [provider]  Omega-3 Fatty Acids (FISH OIL PO) Take 1 capsule by mouth daily.    [provider]  predniSONE (DELTASONE) 5 MG tablet Take 2 pills daily for 1 week then resume 1 pill daily. 10/27/18   Hoyt Koch, MD  traMADol (ULTRAM) 50 MG tablet Take 1 tablet (50 mg total) by mouth every 12 (twelve) hours as needed. 06/16/18   Hoyt Koch, MD  TURMERIC PO Take 1 capsule by mouth daily. Reported on 02/11/2016    [provider]    Family History Family History  Problem Relation Age of Onset  . Diabetes Sister     Social History Social History   Tobacco Use  . Smoking status: Never Smoker  . Smokeless tobacco: Never Used  Substance Use Topics  . Alcohol use: No  . Drug use: No     Allergies   Celebrex [celecoxib]   Review of Systems Review of Systems  Constitutional: Negative for activity change, chills and fever.  HENT: Positive for congestion.   Respiratory: Negative for shortness of breath.   Cardiovascular: Negative for chest pain, palpitations and leg swelling.  Gastrointestinal: Negative for abdominal pain, blood in stool, diarrhea, nausea and vomiting.  Genitourinary: Negative for dysuria, urgency, vaginal bleeding and vaginal discharge.  Musculoskeletal: Negative for back pain, joint swelling, neck pain and neck stiffness.  Skin: Negative for rash.  Allergic/Immunologic: Negative for immunocompromised state.  Neurological: Positive for seizures. Negative for dizziness, syncope, weakness and headaches.  Psychiatric/Behavioral: Negative for confusion.   Physical Exam Updated Vital Signs BP 121/86   Pulse 70   Temp 98.2 F (36.8 C) (Oral)   Resp 16   Ht 5\' 6"  (1.676 m)   Wt 74.8 kg   SpO2 97%   BMI 26.63 kg/m   Physical Exam Vitals signs and nursing note reviewed.  Constitutional:      General: She is not in acute distress. HENT:     Head: Normocephalic.     Nose: No rhinorrhea.  Eyes:     Extraocular Movements: Extraocular movements intact.     Conjunctiva/sclera: Conjunctivae normal.     Pupils: Pupils are equal, round, and reactive to light.  Neck:     Musculoskeletal: Normal range of motion and neck supple. No neck rigidity or  muscular tenderness.     Vascular: No carotid bruit.  Cardiovascular:     Rate and Rhythm: Normal rate and regular rhythm.     Heart sounds: No murmur. No friction rub. No gallop.   Pulmonary:     Effort: Pulmonary effort is normal. No respiratory distress.     Breath sounds: No stridor. No wheezing, rhonchi or rales.  Chest:     Chest wall: No tenderness.  Abdominal:     General: There is no distension.     Palpations: Abdomen is soft. There is no  mass.     Tenderness: There is no abdominal tenderness. There is no right CVA tenderness, left CVA tenderness, guarding or rebound.     Hernia: No hernia is present.  Lymphadenopathy:     Cervical: No cervical adenopathy.  Skin:    General: Skin is warm.     Findings: No rash.  Neurological:     Mental Status: She is alert.     Comments: Cranial nerves II through XII are grossly intact.  Normal finger-to-nose and heel-to-shin bilaterally without dysmetria.  Normal rapid alternating movements.  5 out of 5 strength against resistance of the bilateral upper and lower extremities.  Sensation is intact and equal throughout.  No pronator drift.  Negative Romberg.  No clonus bilaterally.  Gait is not ataxic.  No rashes.  Psychiatric:        Behavior: Behavior normal.    ED Treatments / Results  Labs (all labs ordered are listed, but only abnormal results are displayed) Labs Reviewed  BASIC METABOLIC PANEL - Abnormal; Notable for the following components:      Result Value   Sodium 132 (*)    Potassium 3.3 (*)    CO2 20 (*)    Glucose, Bld 123 (*)    Calcium 8.8 (*)    All other components within normal limits  CBC - Abnormal; Notable for the following components:   Hemoglobin 10.3 (*)    HCT 33.5 (*)    MCV 68.8 (*)    MCH 21.1 (*)    RDW 17.1 (*)    Platelets 113 (*)    All other components within normal limits  CBG MONITORING, ED - Abnormal; Notable for the following components:   Glucose-Capillary 112 (*)    All other  components within normal limits  I-STAT BETA HCG BLOOD, ED ([facility], [facility], [facility] ONLY)    EKG None  Radiology No results found.  Procedures Procedures (including critical care time)  Medications Ordered in ED Medications - No data to display   Initial Impression / Assessment and Plan / ED Course  I have reviewed the triage vital signs and the nursing notes.  Pertinent labs & imaging results that were available during my care of the patient were reviewed by me and considered in my medical decision making (see chart for details).        38 year old female with a history of systemic lupus erythematous, DDD s/p ALIF L5-S1 senting via EMS after having an episode that sounds consistent with a generalized tonic-clonic seizure that lasted for approximately 5 minutes prior to arrival.  The patient has had no seizure-like activity since she has been in the ER.  Vital signs have been reassuring.  Labs are notable for hemoglobin of 10.3, calcium of 8.8, potassium of 3.3, and hyponatremia of 132.  She has also been taking decongestants that may have decreased her seizure threshold and she has had decreased sleep due to her symptoms.  Given active lupus flare, neurology was consulted.  Spoke with Dr. Rory Percy who recommends MRI brain with and without to assess for neurologic lupus.  MRI brain is pending.  If MRI is unremarkable, the patient may be discharged home without antiepileptics and follow-up with neurology in the clinic and she should be instructed not to drive for the next 6 months.  If unremarkable, reconsult neurology.  The plan was discussed with Dr. Christy Gentles, attending physician.  Patient care transferred to [place] at the end of my shift. Patient presentation, ED course, and  plan of care discussed with review of all pertinent labs and imaging. Please see his/her note for further details regarding further ED course and disposition.   Final Clinical Impressions(s) / ED Diagnoses   Final diagnoses:   None    ED Discharge Orders    None       Joanne Gavel, PA-C 11/03/18 0724    Ripley Fraise, MD 11/03/18 2327

## 2018-11-03 NOTE — ED Notes (Signed)
Please see downtime record for data between the hours of 0130 and 0500.

## 2018-11-03 NOTE — ED Notes (Signed)
Patient transported to MRI 

## 2018-11-03 NOTE — ED Triage Notes (Signed)
BIB [facility] after witnessed Tonic Clonic Seizure. Husband states no hx of seizures. Pt recently seen at primary care and dx with a cold. EMS states drop on BP with orthostatics. Received 4mg  Zofran PTA. Pt tested negative for Flu @ PCP. Husband @ bedside.

## 2018-11-03 NOTE — ED Notes (Signed)
Pt updated on POC by provider and agreed to continue to wait.

## 2018-11-03 NOTE — Discharge Instructions (Addendum)
Your MRI and labs overall look good today.  The cold medications you have been taking may have contributed to your seizure, please discontinue using these.  You will need to follow-up with neurology for further evaluation, please call to schedule an appointment.  No driving for 6 months or until you are cleared by neurology.  If you have another seizure please return to the emergency department.

## 2018-11-03 NOTE — ED Notes (Signed)
Pt in MRI, will round when pt returns

## 2018-11-03 NOTE — ED Notes (Signed)
No further seizure activity since arrival. Pt alert to baseline, reports generalized weakness. Provided updates regarding MRI, apologized for wait.

## 2018-11-14 ENCOUNTER — Telehealth: Payer: Self-pay | Admitting: Internal Medicine

## 2018-11-14 NOTE — Telephone Encounter (Signed)
Copied from [place] [phone]. Topic: Quick Communication - See Telephone Encounter >> Nov 14, 2018 10:51 AM Blase Mess A wrote: CRM for notification. See Telephone encounter for: 11/14/18.  Patient is calling because she was seen in the hospital for seizure. Patient is requesting a script for medication for anxiety. And is wanting to know should the medication for predniSONE (DELTASONE) 5 MG tablet [[id number]] should be decreased?  Requesting a consult over the phone Please advise. CB [phone]

## 2018-11-14 NOTE — Telephone Encounter (Signed)
She should talk to her rheumaologist about decreasing prednisone. Why is she requesting anxiety medication?

## 2018-11-14 NOTE — Telephone Encounter (Signed)
LVM with MD response and to call back and let us know why requesting anxiety medication

## 2018-11-14 NOTE — Telephone Encounter (Signed)
Patient is having a lot of anxiety and having problems sleeping.  The patient states that Dr. Sharlet Salina was the one the increased the prednisone.  The patient states that if a phone consultation is necessary that is fine.

## 2018-11-15 NOTE — Telephone Encounter (Signed)
The increase was only while sick and this was more than 3 weeks ago. She was supposed to decrease prednisone back to normal 5 mg dose once better (typically would be a week or less). Would recommend meditation, deep breathing, journaling, walks for anxiety and can try benadryl for sleep otc.

## 2018-11-15 NOTE — Telephone Encounter (Signed)
LVM with MD response  

## 2018-11-16 DIAGNOSIS — F4310 Post-traumatic stress disorder, unspecified: Secondary | ICD-10-CM | POA: Diagnosis not present

## 2018-11-22 ENCOUNTER — Telehealth: Payer: Self-pay | Admitting: *Deleted

## 2018-11-22 NOTE — Telephone Encounter (Signed)
Left message that we had to cancel her new patient office visit, on 11/23/2018, due to the [place] precautions.  I informed her we are able to set up a virtual visit with her so that her care is not delayed.  Provided our number to call back.  When patient calls back, please get consent for a virtual visit.  If she declines, please get her rescheduled for another new patient appt.

## 2018-11-23 ENCOUNTER — Ambulatory Visit: Payer: BLUE CROSS/BLUE SHIELD | Admitting: Neurology

## 2018-11-25 DIAGNOSIS — Z79899 Other long term (current) drug therapy: Secondary | ICD-10-CM | POA: Diagnosis not present

## 2018-12-01 ENCOUNTER — Encounter: Payer: Self-pay | Admitting: Internal Medicine

## 2018-12-02 ENCOUNTER — Encounter: Payer: Self-pay | Admitting: Internal Medicine

## 2018-12-05 ENCOUNTER — Ambulatory Visit (INDEPENDENT_AMBULATORY_CARE_PROVIDER_SITE_OTHER): Payer: BLUE CROSS/BLUE SHIELD | Admitting: Internal Medicine

## 2018-12-05 ENCOUNTER — Encounter: Payer: Self-pay | Admitting: Internal Medicine

## 2018-12-05 ENCOUNTER — Telehealth: Payer: Self-pay | Admitting: *Deleted

## 2018-12-05 DIAGNOSIS — R569 Unspecified convulsions: Secondary | ICD-10-CM

## 2018-12-05 DIAGNOSIS — M3219 Other organ or system involvement in systemic lupus erythematosus: Secondary | ICD-10-CM | POA: Diagnosis not present

## 2018-12-05 DIAGNOSIS — F419 Anxiety disorder, unspecified: Secondary | ICD-10-CM | POA: Diagnosis not present

## 2018-12-05 MED ORDER — BUSPIRONE HCL 5 MG PO TABS: 5.0000 mg | ORAL_TABLET | Freq: Two times a day (BID) | ORAL | 2 refills | Status: DC | PRN

## 2018-12-05 MED ORDER — PREDNISONE 20 MG PO TABS: 20.0000 mg | ORAL_TABLET | Freq: Every day | ORAL | 0 refills | Status: DC

## 2018-12-05 NOTE — Telephone Encounter (Signed)
See 12/05/18 OV note.

## 2018-12-05 NOTE — Assessment & Plan Note (Signed)
It could be medication related as she has just taken nyquil and felt ill prior to the onset of a seizure. Not witnessed. Will follow up with neurology. MRI w and w/out contrast normal without changes of lupus in the brain. Recommendation was for no medication at that time.

## 2018-12-05 NOTE — Telephone Encounter (Signed)
See 12/01/18 patient message.  Left detailed message advising pt she is scheduled with Dr. Sharlet Salina today at 1:20 pm and DOXY test has been sent to the mobile # we have listed. Will call he again prior to 1:20 pm today.

## 2018-12-05 NOTE — Progress Notes (Signed)
Virtual Visit via Video Note  I connected with Kathleen Ashley on 12/05/18 at  1:20 PM EDT by a video enabled telemedicine application and verified that I am speaking with the correct person using two identifiers.   I discussed the limitations of evaluation and management by telemedicine and the availability of in person appointments. The patient expressed understanding and agreed to proceed.  History of Present Illness: The patient is a 38 y.o. female with visit for ER follow up (seen for new seizure after taking cold medicine, MRI brain done without indication of lupus changes, denies recurrent seizure since then, this was about 2-3 weeks ago, waiting on visit from neurology given coronavirus outbreak). She is also having new anxiety (started with this and before, is doing meditation and counseling, denies SI/HI, was wondering if something safe could be called in to help temporarily, does not want something addictive). She denies seizures and has not taken nyquil again (this was the offending agent prior to seizure). She is not driving. Has some chest pain in the middle of her chest which is worse with night time or leaning forward. Denies cough, fevers, SOB, chills. Overall it is stable since onset. Has tried increasing prednisone which seemed to help some.  PMH, Kathleen Ashley, social history reviewed and updated  Observations/Objective: Appearance: normal, breathing appears normal, normal grooming, abdomen does not appear distended, throat normal, memory normal, mental status is A and O times 3  Assessment and Plan: See problem oriented charting  Follow Up Instructions: Increase prednisone to 20 mg daily for 1 week, then 10 mg daily for 1 week then resume 8 mg daily dosing. Rx for buspar for anxiety. Will check on neurology referral for seizure  I discussed the assessment and treatment plan with the patient. The patient was provided an opportunity to ask questions and all were answered. The patient agreed with  the plan and demonstrated an understanding of the instructions.   The patient was advised to call back or seek an in-person evaluation if the symptoms worsen or if the condition fails to improve as anticipated.  Hoyt Koch, MD

## 2018-12-05 NOTE — Assessment & Plan Note (Signed)
Potential flare of lupus in her chest wall. Rx prednisone 20 mg daily for 1 week, then 10 mg daily for 1 week then resume 8 mg daily dosing. Unable to bring her in for labs currently due to pandemic.

## 2018-12-05 NOTE — Assessment & Plan Note (Signed)
Rx for buspar and affirmed meditation and coping skills. She will continue to see therapist for help as well.

## 2018-12-19 DIAGNOSIS — N939 Abnormal uterine and vaginal bleeding, unspecified: Secondary | ICD-10-CM | POA: Diagnosis not present

## 2018-12-19 DIAGNOSIS — D251 Intramural leiomyoma of uterus: Secondary | ICD-10-CM | POA: Diagnosis not present

## 2018-12-20 DIAGNOSIS — Z79899 Other long term (current) drug therapy: Secondary | ICD-10-CM | POA: Diagnosis not present

## 2018-12-29 DIAGNOSIS — F4310 Post-traumatic stress disorder, unspecified: Secondary | ICD-10-CM | POA: Diagnosis not present

## 2018-12-31 ENCOUNTER — Telehealth: Payer: Self-pay | Admitting: Internal Medicine

## 2019-01-02 MED ORDER — CYCLOBENZAPRINE HCL 10 MG PO TABS: 10.0000 mg | ORAL_TABLET | Freq: Every day | ORAL | 3 refills | Status: DC

## 2019-01-02 NOTE — Telephone Encounter (Signed)
flexeril

## 2019-01-02 NOTE — Telephone Encounter (Signed)
Sent in alternate dosage flexeril.

## 2019-01-02 NOTE — Telephone Encounter (Signed)
What med?

## 2019-01-02 NOTE — Addendum Note (Signed)
Addended by: Pricilla Holm A on: 01/02/2019 03:24 PM   Modules accepted: Orders

## 2019-01-02 NOTE — Telephone Encounter (Signed)
Is it okay to send in 10 mg daily instead of 5mg  twice a day so it will be covered by insurance?

## 2019-01-02 NOTE — Telephone Encounter (Signed)
Patient called to inquire if this medication can be sent in as 10 mg so her insurance will cover it. She is requesting call back from PCP or CMA to discuss further. Please advise.

## 2019-01-05 DIAGNOSIS — Z79899 Other long term (current) drug therapy: Secondary | ICD-10-CM | POA: Diagnosis not present

## 2019-01-05 DIAGNOSIS — F1121 Opioid dependence, in remission: Secondary | ICD-10-CM | POA: Diagnosis not present

## 2019-01-06 ENCOUNTER — Other Ambulatory Visit: Payer: Self-pay | Admitting: Internal Medicine

## 2019-01-06 NOTE — Telephone Encounter (Signed)
Pt is calling back, she is asking for 10mg . Take 3 a day  Please cb [phone]

## 2019-01-06 NOTE — Telephone Encounter (Signed)
She is currently prescribed 5 mg BID. She asked for dosing change due to insurance coverage to 10 mg daily.

## 2019-01-06 NOTE — Telephone Encounter (Signed)
Control database checked last refill: 12/07/2018 LOV: 12/05/2018 [id number]

## 2019-01-25 ENCOUNTER — Other Ambulatory Visit: Payer: Self-pay | Admitting: Internal Medicine

## 2019-01-25 DIAGNOSIS — M3219 Other organ or system involvement in systemic lupus erythematosus: Secondary | ICD-10-CM

## 2019-01-25 NOTE — Telephone Encounter (Signed)
Copied from [place] [id number]. Topic: Quick Communication - Rx Refill/Question >> Jan 25, 2019  1:38 PM Richardo Priest, NT wrote: Medication:  predniSONE (DELTASONE) 20 MG tablet  Has the patient contacted their pharmacy? Yes patient states no refills.  Preferred Pharmacy (with phone number or street name):  [id number] - [place], [address] AT [address] [phone] (Phone) [phone] (Fax)  Agent: Please be advised that RX refills may take up to 3 business days. We ask that you follow-up with your pharmacy.

## 2019-01-25 NOTE — Telephone Encounter (Signed)
Routing to dr crawford, are you ok with refill or do you need office visit--please advise, I will call patient back, thanks

## 2019-01-26 ENCOUNTER — Other Ambulatory Visit: Payer: Self-pay | Admitting: Internal Medicine

## 2019-01-26 NOTE — Telephone Encounter (Signed)
Why is she requesting refill? Is she having some kind of flare? Her rheumatologist generally prescribes her prednisone long term.

## 2019-01-26 NOTE — Telephone Encounter (Signed)
Routing to dr crawford---pt states she is not seeing rheumatologist anymore----she is doing ok for now---yesterday she experienced pain in her lungs but most days she is ok, she is requesting refill to have just in case it gets really bad one day---and she is ok if you want to reduce the mg per tablet (to be less than 20mg  tablet)---please advise, I will call patient back, thanks

## 2019-01-26 NOTE — Telephone Encounter (Signed)
This mg prednisone does not match what she was taking previously. She previously was taking 5 mg daily and is now asking for 20 mg daily. When was last visit to rheumatologist and what dosage prednisone is she currently taking?

## 2019-01-26 NOTE — Telephone Encounter (Signed)
Patient does not see old rheumatologist. Patient would like to go to Dr.Mayur K. Patel. Patient needs referral from PCP. Patient is also asking for a prescription to get her while she is setting up an appt with new rheumatologist.   Address: [address] [place], [place] [address] [phone]  Patient call back # [phone]

## 2019-01-26 NOTE — Telephone Encounter (Signed)
lvm advising patient of dr crawfords note/instructions---call back if any further questions

## 2019-01-26 NOTE — Telephone Encounter (Signed)
Patient states that she tapered of the 20mg  of prednisone and now is asking for 5mg  prednisone daily for another week. States been off the prednisone for two days now and last night that discomfort in her lungs came back when laying down and thinks another week would help.   last visit with rheumatologist was Feb. Would like a new referral to rheumatologist listed below

## 2019-01-26 NOTE — Telephone Encounter (Signed)
She needs to see the rheumatologist long term and should get back in with them.

## 2019-01-27 MED ORDER — PREDNISONE 5 MG PO TABS: 5.0000 mg | ORAL_TABLET | Freq: Every day | ORAL | 0 refills | Status: DC

## 2019-01-27 NOTE — Telephone Encounter (Signed)
Referral to rheumatology placed. She will likely need to sign forms with the new office for records from her prior rheumatologist. Sent in 1 month of 5 mg daily prednisone.

## 2019-01-27 NOTE — Addendum Note (Signed)
Addended by: Pricilla Holm A on: 01/27/2019 08:11 AM   Modules accepted: Orders

## 2019-02-02 ENCOUNTER — Encounter: Payer: Self-pay | Admitting: Internal Medicine

## 2019-02-03 NOTE — Progress Notes (Signed)
Left message on [place] [facility] machine at Dr. Charlett Lango office to request orders in epic.

## 2019-02-13 ENCOUNTER — Encounter: Payer: Self-pay | Admitting: Internal Medicine

## 2019-02-13 DIAGNOSIS — Z79899 Other long term (current) drug therapy: Secondary | ICD-10-CM | POA: Diagnosis not present

## 2019-02-15 NOTE — Patient Instructions (Addendum)
Your procedure is scheduled on   Tuesday 02/21/2019   Report to [facility]   Call this number if you have problems the morning of surgery  [phone].   OUR ADDRESS IS [address].  WE ARE LOCATED IN [facility].                                     REMEMBER:  DO NOT EAT FOOD  AFTER MIDNIGHT .  May have clear liquids from midnight up until 0700 am then nothing until after surgery!    CLEAR LIQUID DIET   Foods Allowed                                                                     Foods Excluded  Coffee and tea, regular and decaf                             liquids that you cannot  Plain Jell-O in any flavor                                             see through such as: Fruit ices (not with fruit pulp)                                     milk, soups, orange juice  Iced Popsicles                                    All solid food Carbonated beverages, regular and diet                                    Cranberry, grape and apple juices Sports drinks like Gatorade Lightly seasoned clear broth or consume(fat free) Sugar, honey syrup  Sample Menu Breakfast                                Lunch                                     Supper Cranberry juice                    Beef broth                            Chicken broth Jell-O  Grape juice                           Apple juice Coffee or tea                        Jell-O                                      Popsicle                                                Coffee or tea                        Coffee or tea  _____________________________________________________________________   TAKE THESE MEDICATIONS MORNING OF SURGERY WITH A SIP OF WATER:  Letrozole (Femara), Danazol (Danocrine)  IF YOU ARE SPENDING THE NIGHT AFTER SURGERY PLEASE BRING ALL YOUR PRESCRIPTION MEDICATIONS IN THEIR ORIGINAL BOTTLES.       DO NOT WEAR JEWERLY, MAKE UP, OR NAIL POLISH,  DO NOT WEAR LOTIONS, POWDERS, PERFUMES OR DEODORANT. DO NOT SHAVE FOR 24 HOURS PRIOR TO DAY OF SURGERY.  CONTACTS, GLASSES, OR DENTURES MAY NOT BE WORN TO SURGERY.                                    [facility] IS NOT RESPONSIBLE  FOR ANY BELONGINGS.                                                                    Marland Kitchen                                                                                                    [facility] - Preparing for Surgery Before surgery, you can play an important role.  Because skin is not sterile, your skin needs to be as free of germs as possible.  You can reduce the number of germs on your skin by washing with CHG (chlorahexidine gluconate) soap before surgery.  CHG is an antiseptic cleaner which kills germs and bonds with the skin to continue killing germs even after washing. Please DO NOT use if you have an allergy to CHG or antibacterial soaps.  If your skin becomes reddened/irritated stop using the CHG and inform your nurse when you arrive at Short Stay. Do not shave (including legs and underarms) for at least 48 hours prior to the first CHG shower.  You may shave your face/neck. Please follow these  instructions carefully:  1.  Shower with CHG Soap the night before surgery and the  morning of Surgery.  2.  If you choose to wash your hair, wash your hair first as usual with your  normal  shampoo.  3.  After you shampoo, rinse your hair and body thoroughly to remove the  shampoo.                           4.  Use CHG as you would any other liquid soap.  You can apply chg directly  to the skin and wash                       Gently with a scrungie or clean washcloth.  5.  Apply the CHG Soap to your body ONLY FROM THE NECK DOWN.   Do not use on face/ open                           Wound or open sores. Avoid contact with eyes, ears mouth and genitals (private parts).                       Wash face,  Genitals  (private parts) with your normal soap.             6.  Wash thoroughly, paying special attention to the area where your surgery  will be performed.  7.  Thoroughly rinse your body with warm water from the neck down.  8.  DO NOT shower/wash with your normal soap after using and rinsing off  the CHG Soap.                9.  Pat yourself dry with a clean towel.            10.  Wear clean pajamas.            11.  Place clean sheets on your bed the night of your first shower and do not  sleep with pets. Day of Surgery : Do not apply any lotions/deodorants the morning of surgery.  Please wear clean clothes to the hospital/surgery center.  FAILURE TO FOLLOW THESE INSTRUCTIONS MAY RESULT IN THE CANCELLATION OF YOUR SURGERY PATIENT SIGNATURE_________________________________  NURSE SIGNATURE__________________________________  ________________________________________________________________________

## 2019-02-16 ENCOUNTER — Encounter (HOSPITAL_COMMUNITY)
Admission: RE | Admit: 2019-02-16 | Discharge: 2019-02-16 | Disposition: A | Discharge status: Home / Self Care | Payer: BC Managed Care – PPO | Source: Ambulatory Visit | Attending: Internal Medicine | Admitting: Internal Medicine

## 2019-02-17 ENCOUNTER — Encounter (HOSPITAL_COMMUNITY)
Admission: RE | Admit: 2019-02-17 | Discharge: 2019-02-17 | Disposition: A | Discharge status: Home / Self Care | Payer: BC Managed Care – PPO | Source: Ambulatory Visit | Attending: Obstetrics and Gynecology | Admitting: Obstetrics and Gynecology

## 2019-02-17 ENCOUNTER — Encounter (HOSPITAL_COMMUNITY): Payer: Self-pay

## 2019-02-17 ENCOUNTER — Other Ambulatory Visit: Payer: Self-pay

## 2019-02-17 ENCOUNTER — Other Ambulatory Visit (HOSPITAL_COMMUNITY)
Admission: RE | Admit: 2019-02-17 | Discharge: 2019-02-17 | Disposition: A | Discharge status: Home / Self Care | Payer: BC Managed Care – PPO | Source: Ambulatory Visit | Attending: Obstetrics and Gynecology | Admitting: Obstetrics and Gynecology

## 2019-02-17 DIAGNOSIS — Z1159 Encounter for screening for other viral diseases: Secondary | ICD-10-CM | POA: Insufficient documentation

## 2019-02-17 DIAGNOSIS — Z01812 Encounter for preprocedural laboratory examination: Secondary | ICD-10-CM | POA: Diagnosis not present

## 2019-02-17 DIAGNOSIS — N939 Abnormal uterine and vaginal bleeding, unspecified: Secondary | ICD-10-CM | POA: Diagnosis not present

## 2019-02-17 DIAGNOSIS — D251 Intramural leiomyoma of uterus: Secondary | ICD-10-CM | POA: Diagnosis not present

## 2019-02-17 HISTORY — DX: Unspecified convulsions: R56.9

## 2019-02-17 LAB — CBC
HCT: 33.1 % — ABNORMAL LOW (ref 36.0–46.0)
Hemoglobin: 9.9 g/dL — ABNORMAL LOW (ref 12.0–15.0)
MCH: 22.7 pg — ABNORMAL LOW (ref 26.0–34.0)
MCHC: 29.9 g/dL — ABNORMAL LOW (ref 30.0–36.0)
MCV: 75.7 fL — ABNORMAL LOW (ref 80.0–100.0)
Platelets: 169 10*3/uL (ref 150–400)
RBC: 4.37 MIL/uL (ref 3.87–5.11)
RDW: 21.2 % — ABNORMAL HIGH (ref 11.5–15.5)
WBC: 6.9 10*3/uL (ref 4.0–10.5)
nRBC: 0 % (ref 0.0–0.2)

## 2019-02-17 LAB — BASIC METABOLIC PANEL
Anion gap: 6 (ref 5–15)
BUN: 11 mg/dL (ref 6–20)
CO2: 26 mmol/L (ref 22–32)
Calcium: 8.5 mg/dL — ABNORMAL LOW (ref 8.9–10.3)
Chloride: 105 mmol/L (ref 98–111)
Creatinine, Ser: 0.53 mg/dL (ref 0.44–1.00)
GFR calc Af Amer: >60 mL/min (ref >60)
GFR calc non Af Amer: >60 mL/min (ref >60)
Glucose, Bld: 87 mg/dL (ref 70–99)
Potassium: 3.7 mmol/L (ref 3.5–5.1)
Sodium: 137 mmol/L (ref 135–145)

## 2019-02-17 LAB — SARS CORONAVIRUS 2 (TAT 6-24 HRS): SARS Coronavirus 2: NEGATIVE

## 2019-02-17 LAB — ABO/RH: ABO/RH(D): A NEG

## 2019-02-17 NOTE — Progress Notes (Signed)
Pre-op appt :  Patient states she has not been given instruction about her Plaquenil as of yet. Patient reports that she has an appt with her surgeon Colman Cater today at 3pm. RN advised patient to discuss Plaquenil with her surgeon today . Patient verbalized understanding

## 2019-02-17 NOTE — Patient Instructions (Addendum)
DUE TO COVID-19 NO VISITORS ARE ALLOWED IN THE HOSPITAL AT THIS TIME   COVID SWAB TESTING MUST BE COMPLETED ON Friday , June 26th: IMMEDIATELY AFTER PRE OP APPOINTMENT  (Must self quarantine after testing. Follow instructions on handout.)    Your procedure is scheduled on: Tuesday, February 21, 2019   Surgery Time: 1:00pm-3:47pm   Report to [facility] Entrance at 11:00    Address [address]      Call this number if you have problems the morning of surgery [phone]   Do not eat food After Midnight. YOU MAY HAVE CLEAR LIQUIDS FROM MIDNIGHT UNTIL 7:00AM.  Nothing by mouth after 7:00am!        CLEAR LIQUID DIET  Foods Allowed                                                                     Foods Excluded  Water, Black Coffee and tea, regular and decaf                             liquids that you cannot  Plain Jell-O in any flavor                                             see through such as: Fruit ices (not with fruit pulp)                                     milk, soups, orange juice  Iced Popsicles                                    All solid food Carbonated beverages, regular and diet                                    Cranberry, grape and apple juices Sports drinks like Gatorade Lightly seasoned clear broth or consume(fat free) Sugar, honey syrup  Sample Menu Breakfast                                Lunch                                     Supper Cranberry juice                    Beef broth                            Chicken broth Jell-O  Grape juice                           Apple juice Coffee or tea                        Jell-O                                      Popsicle                                                Coffee or tea                        Coffee or tea      Brush your teeth the morning of surgery.    Take these medicines the morning of surgery with A SIP OF WATER: Letrozole,  Danazol ,                                You may not have any metal on your body including hair pins, jewelry, and body piercings             Do not wear make-up, lotions, powders, perfumes/cologne, or deodorant             Do not wear nail polish.  Do not shave  48 hours prior to surgery.                Do not bring valuables to the hospital. [place].   Contacts, dentures or bridgework may not be worn into surgery.   Bring small overnight bag day of surgery.                 Please read over the following fact sheets you were given:  [facility] - Preparing for Surgery Before surgery, you can play an important role.  Because skin is not sterile, your skin needs to be as free of germs as possible.  You can reduce the number of germs on your skin by washing with CHG (chlorahexidine gluconate) soap before surgery.  CHG is an antiseptic cleaner which kills germs and bonds with the skin to continue killing germs even after washing. Please DO NOT use if you have an allergy to CHG or antibacterial soaps.  If your skin becomes reddened/irritated stop using the CHG and inform your nurse when you arrive at Short Stay. Do not shave (including legs and underarms) for at least 48 hours prior to the first CHG shower.  You may shave your face/neck.  Please follow these instructions carefully:  1.  Shower with CHG Soap the night before surgery and the  morning of surgery.  2.  If you choose to wash your hair, wash your hair first as usual with your normal  shampoo.  3.  After you shampoo, rinse your hair and body thoroughly to remove the shampoo.  4.  Use CHG as you would any other liquid soap.  You can apply chg directly to the skin and wash.  Gently with a scrungie or clean washcloth.  5.  Apply the CHG Soap to your body ONLY FROM THE NECK DOWN.   Do   not use on face/ open                           Wound or open sores. Avoid  contact with eyes, ears mouth and   genitals (private parts).                       Wash face,  Genitals (private parts) with your normal soap.             6.  Wash thoroughly, paying special attention to the area where your    surgery  will be performed.  7.  Thoroughly rinse your body with warm water from the neck down.  8.  DO NOT shower/wash with your normal soap after using and rinsing off the CHG Soap.                9.  Pat yourself dry with a clean towel.            10.  Wear clean pajamas.            11.  Place clean sheets on your bed the night of your first shower and do not  sleep with pets. Day of Surgery : Do not apply any lotions/deodorants the morning of surgery.  Please wear clean clothes to the hospital/surgery center.  FAILURE TO FOLLOW THESE INSTRUCTIONS MAY RESULT IN THE CANCELLATION OF YOUR SURGERY  PATIENT SIGNATURE_________________________________  NURSE SIGNATURE__________________________________  ________________________________________________________________________  WHAT IS A BLOOD TRANSFUSION? Blood Transfusion Information  A transfusion is the replacement of blood or some of its parts. Blood is made up of multiple cells which provide different functions.  Red blood cells carry oxygen and are used for blood loss replacement.  White blood cells fight against infection.  Platelets control bleeding.  Plasma helps clot blood.  Other blood products are available for specialized needs, such as hemophilia or other clotting disorders. BEFORE THE TRANSFUSION  Who gives blood for transfusions?   Healthy volunteers who are fully evaluated to make sure their blood is safe. This is blood bank blood. Transfusion therapy is the safest it has ever been in the practice of medicine. Before blood is taken from a donor, a complete history is taken to make sure that person has no history of diseases nor engages in risky social behavior (examples are intravenous drug use  or sexual activity with multiple partners). The donor's travel history is screened to minimize risk of transmitting infections, such as malaria. The donated blood is tested for signs of infectious diseases, such as HIV and hepatitis. The blood is then tested to be sure it is compatible with you in order to minimize the chance of a transfusion reaction. If you or a relative donates blood, this is often done in anticipation of surgery and is not appropriate for emergency situations. It takes many days to process the donated blood. RISKS AND COMPLICATIONS Although transfusion therapy is very safe and saves many lives, the main dangers of transfusion include:   Getting an infectious disease.  Developing a transfusion reaction. This is an allergic reaction to something in the blood you were given. Every precaution  is taken to prevent this. The decision to have a blood transfusion has been considered carefully by your caregiver before blood is given. Blood is not given unless the benefits outweigh the risks. AFTER THE TRANSFUSION  Right after receiving a blood transfusion, you will usually feel much better and more energetic. This is especially true if your red blood cells have gotten low (anemic). The transfusion raises the level of the red blood cells which carry oxygen, and this usually causes an energy increase.  The nurse administering the transfusion will monitor you carefully for complications. HOME CARE INSTRUCTIONS  No special instructions are needed after a transfusion. You may find your energy is better. Speak with your caregiver about any limitations on activity for underlying diseases you may have. SEEK MEDICAL CARE IF:   Your condition is not improving after your transfusion.  You develop redness or irritation at the intravenous (IV) site. SEEK IMMEDIATE MEDICAL CARE IF:  Any of the following symptoms occur over the next 12 hours:  Shaking chills.  You have a temperature by mouth  above 102 F (38.9 C), not controlled by medicine.  Chest, back, or muscle pain.  People around you feel you are not acting correctly or are confused.  Shortness of breath or difficulty breathing.  Dizziness and fainting.  You get a rash or develop hives.  You have a decrease in urine output.  Your urine turns a dark color or changes to pink, red, or brown. Any of the following symptoms occur over the next 10 days:  You have a temperature by mouth above 102 F (38.9 C), not controlled by medicine.  Shortness of breath.  Weakness after normal activity.  The white part of the eye turns yellow (jaundice).  You have a decrease in the amount of urine or are urinating less often.  Your urine turns a dark color or changes to pink, red, or brown. Document Released: 08/07/2000 Document Revised: 11/02/2011 Document Reviewed: 03/26/2008 [facility] Patient Information 2014 [place], [place].  _______________________________________________________________________

## 2019-02-21 ENCOUNTER — Ambulatory Visit (HOSPITAL_BASED_OUTPATIENT_CLINIC_OR_DEPARTMENT_OTHER)
Admission: RE | Admit: 2019-02-21 | Discharge: 2019-02-21 | Disposition: A | Discharge status: Home / Self Care | Payer: BC Managed Care – PPO | Attending: Obstetrics and Gynecology | Admitting: Obstetrics and Gynecology

## 2019-02-21 ENCOUNTER — Encounter (HOSPITAL_BASED_OUTPATIENT_CLINIC_OR_DEPARTMENT_OTHER): Payer: Self-pay | Admitting: Anesthesiology

## 2019-02-21 ENCOUNTER — Ambulatory Visit (HOSPITAL_BASED_OUTPATIENT_CLINIC_OR_DEPARTMENT_OTHER): Payer: BC Managed Care – PPO | Admitting: Anesthesiology

## 2019-02-21 ENCOUNTER — Encounter (HOSPITAL_BASED_OUTPATIENT_CLINIC_OR_DEPARTMENT_OTHER)
Admission: RE | Disposition: A | Discharge status: Home / Self Care | Payer: Self-pay | Source: Home / Self Care | Attending: Obstetrics and Gynecology

## 2019-02-21 ENCOUNTER — Ambulatory Visit (HOSPITAL_BASED_OUTPATIENT_CLINIC_OR_DEPARTMENT_OTHER): Payer: BC Managed Care – PPO | Admitting: Physician Assistant

## 2019-02-21 ENCOUNTER — Other Ambulatory Visit: Payer: Self-pay

## 2019-02-21 DIAGNOSIS — Z886 Allergy status to analgesic agent status: Secondary | ICD-10-CM | POA: Insufficient documentation

## 2019-02-21 DIAGNOSIS — Z9884 Bariatric surgery status: Secondary | ICD-10-CM | POA: Diagnosis not present

## 2019-02-21 DIAGNOSIS — Z888 Allergy status to other drugs, medicaments and biological substances status: Secondary | ICD-10-CM | POA: Diagnosis not present

## 2019-02-21 DIAGNOSIS — N803 Endometriosis of pelvic peritoneum: Secondary | ICD-10-CM | POA: Diagnosis not present

## 2019-02-21 DIAGNOSIS — Z833 Family history of diabetes mellitus: Secondary | ICD-10-CM | POA: Insufficient documentation

## 2019-02-21 DIAGNOSIS — N736 Female pelvic peritoneal adhesions (postinfective): Secondary | ICD-10-CM | POA: Insufficient documentation

## 2019-02-21 DIAGNOSIS — M329 Systemic lupus erythematosus, unspecified: Secondary | ICD-10-CM | POA: Diagnosis not present

## 2019-02-21 DIAGNOSIS — F419 Anxiety disorder, unspecified: Secondary | ICD-10-CM | POA: Insufficient documentation

## 2019-02-21 DIAGNOSIS — M545 Low back pain: Secondary | ICD-10-CM | POA: Diagnosis not present

## 2019-02-21 DIAGNOSIS — N920 Excessive and frequent menstruation with regular cycle: Secondary | ICD-10-CM | POA: Diagnosis not present

## 2019-02-21 DIAGNOSIS — N801 Endometriosis of ovary: Secondary | ICD-10-CM | POA: Diagnosis not present

## 2019-02-21 DIAGNOSIS — R569 Unspecified convulsions: Secondary | ICD-10-CM | POA: Diagnosis not present

## 2019-02-21 DIAGNOSIS — F329 Major depressive disorder, single episode, unspecified: Secondary | ICD-10-CM | POA: Diagnosis not present

## 2019-02-21 DIAGNOSIS — D649 Anemia, unspecified: Secondary | ICD-10-CM | POA: Insufficient documentation

## 2019-02-21 DIAGNOSIS — M1990 Unspecified osteoarthritis, unspecified site: Secondary | ICD-10-CM | POA: Diagnosis not present

## 2019-02-21 DIAGNOSIS — D259 Leiomyoma of uterus, unspecified: Secondary | ICD-10-CM | POA: Insufficient documentation

## 2019-02-21 DIAGNOSIS — Z981 Arthrodesis status: Secondary | ICD-10-CM | POA: Insufficient documentation

## 2019-02-21 DIAGNOSIS — N809 Endometriosis, unspecified: Secondary | ICD-10-CM | POA: Diagnosis not present

## 2019-02-21 DIAGNOSIS — Z79899 Other long term (current) drug therapy: Secondary | ICD-10-CM | POA: Diagnosis not present

## 2019-02-21 DIAGNOSIS — D251 Intramural leiomyoma of uterus: Secondary | ICD-10-CM | POA: Diagnosis not present

## 2019-02-21 DIAGNOSIS — E559 Vitamin D deficiency, unspecified: Secondary | ICD-10-CM | POA: Diagnosis not present

## 2019-02-21 DIAGNOSIS — Z885 Allergy status to narcotic agent status: Secondary | ICD-10-CM | POA: Insufficient documentation

## 2019-02-21 DIAGNOSIS — N800 Endometriosis of uterus: Secondary | ICD-10-CM | POA: Diagnosis not present

## 2019-02-21 HISTORY — PX: LAPAROSCOPIC GELPORT ASSISTED MYOMECTOMY: SHX6549

## 2019-02-21 LAB — TYPE AND SCREEN
ABO/RH(D): A NEG
Antibody Screen: NEGATIVE

## 2019-02-21 SURGERY — LAPAROSCOPIC GELPORT ASSISTED MYOMECTOMY
Anesthesia: General | Site: Abdomen

## 2019-02-21 MED ORDER — ONDANSETRON HCL 4 MG PO TABS: 4.0000 mg | ORAL_TABLET | Freq: Every day | ORAL | 1 refills | Status: AC | PRN

## 2019-02-21 MED ORDER — ARTIFICIAL TEARS OPHTHALMIC OINT
TOPICAL_OINTMENT | OPHTHALMIC | Status: AC
  Filled 2019-02-21: qty 3.5

## 2019-02-21 MED ORDER — METHYLENE BLUE 0.5 % INJ SOLN
INTRAVENOUS | Status: DC | PRN
  Administered 2019-02-21: 5 mL via SUBMUCOSAL

## 2019-02-21 MED ORDER — SCOPOLAMINE 1 MG/3DAYS TD PT72
1.0000 | MEDICATED_PATCH | TRANSDERMAL | Status: DC
  Administered 2019-02-21: 12:00:00 1.5 mg via TRANSDERMAL
  Filled 2019-02-21: qty 1

## 2019-02-21 MED ORDER — KETOROLAC TROMETHAMINE 30 MG/ML IJ SOLN
INTRAMUSCULAR | Status: AC
  Filled 2019-02-21: qty 1

## 2019-02-21 MED ORDER — OXYCODONE HCL 5 MG/5ML PO SOLN
5.0000 mg | Freq: Once | ORAL | Status: DC | PRN
  Filled 2019-02-21: qty 5

## 2019-02-21 MED ORDER — SCOPOLAMINE 1 MG/3DAYS TD PT72
MEDICATED_PATCH | TRANSDERMAL | Status: AC
  Filled 2019-02-21: qty 1

## 2019-02-21 MED ORDER — OXYCODONE-ACETAMINOPHEN 5-325 MG PO TABS
1.0000 | ORAL_TABLET | ORAL | Status: DC | PRN
  Administered 2019-02-21: 1 via ORAL
  Filled 2019-02-21: qty 1

## 2019-02-21 MED ORDER — MEPERIDINE HCL 25 MG/ML IJ SOLN
6.2500 mg | INTRAMUSCULAR | Status: DC | PRN
  Filled 2019-02-21: qty 1

## 2019-02-21 MED ORDER — FENTANYL CITRATE (PF) 100 MCG/2ML IJ SOLN
INTRAMUSCULAR | Status: DC | PRN
  Administered 2019-02-21: 250 ug via INTRAVENOUS

## 2019-02-21 MED ORDER — PROPOFOL 10 MG/ML IV BOLUS
INTRAVENOUS | Status: AC
  Filled 2019-02-21: qty 20

## 2019-02-21 MED ORDER — FENTANYL CITRATE (PF) 100 MCG/2ML IJ SOLN
INTRAMUSCULAR | Status: AC
  Filled 2019-02-21: qty 2

## 2019-02-21 MED ORDER — FENTANYL CITRATE (PF) 100 MCG/2ML IJ SOLN
25.0000 ug | INTRAMUSCULAR | Status: DC | PRN
  Administered 2019-02-21: 17:00:00 50 ug via INTRAVENOUS
  Administered 2019-02-21: 16:00:00 25 ug via INTRAVENOUS
  Administered 2019-02-21: 50 ug via INTRAVENOUS
  Administered 2019-02-21: 16:00:00 25 ug via INTRAVENOUS
  Filled 2019-02-21: qty 1

## 2019-02-21 MED ORDER — LIDOCAINE 2% (20 MG/ML) 5 ML SYRINGE
INTRAMUSCULAR | Status: AC
  Filled 2019-02-21: qty 5

## 2019-02-21 MED ORDER — OXYCODONE HCL 5 MG PO TABS
ORAL_TABLET | ORAL | Status: AC
  Filled 2019-02-21: qty 1

## 2019-02-21 MED ORDER — OXYCODONE-ACETAMINOPHEN 7.5-325 MG PO TABS: 1.0000 | ORAL_TABLET | ORAL | 0 refills | Status: DC | PRN

## 2019-02-21 MED ORDER — KETOROLAC TROMETHAMINE 30 MG/ML IJ SOLN
INTRAMUSCULAR | Status: DC | PRN
  Administered 2019-02-21: 30 mg via INTRAVENOUS

## 2019-02-21 MED ORDER — SUGAMMADEX SODIUM 200 MG/2ML IV SOLN
INTRAVENOUS | Status: AC
  Filled 2019-02-21: qty 2

## 2019-02-21 MED ORDER — KETAMINE HCL 10 MG/ML IJ SOLN
INTRAMUSCULAR | Status: DC | PRN
  Administered 2019-02-21: 15 mg via INTRAVENOUS
  Administered 2019-02-21: 20 mg via INTRAVENOUS

## 2019-02-21 MED ORDER — ACETAMINOPHEN 500 MG PO TABS
1000.0000 mg | ORAL_TABLET | Freq: Once | ORAL | Status: AC
  Administered 2019-02-21: 1000 mg via ORAL
  Filled 2019-02-21: qty 2

## 2019-02-21 MED ORDER — LIDOCAINE 2% (20 MG/ML) 5 ML SYRINGE
INTRAMUSCULAR | Status: DC | PRN
  Administered 2019-02-21: 80 mg via INTRAVENOUS

## 2019-02-21 MED ORDER — BUPIVACAINE-EPINEPHRINE 0.5% -1:200000 IJ SOLN
INTRAMUSCULAR | Status: DC | PRN
  Administered 2019-02-21: 15 mL

## 2019-02-21 MED ORDER — SUGAMMADEX SODIUM 200 MG/2ML IV SOLN
INTRAVENOUS | Status: DC | PRN
  Administered 2019-02-21: 200 mg via INTRAVENOUS

## 2019-02-21 MED ORDER — OXYCODONE HCL 5 MG PO TABS
2.5000 mg | ORAL_TABLET | ORAL | Status: DC | PRN
  Administered 2019-02-21: 18:00:00 2.5 mg via ORAL
  Filled 2019-02-21: qty 1

## 2019-02-21 MED ORDER — CEFAZOLIN SODIUM-DEXTROSE 2-4 GM/100ML-% IV SOLN
INTRAVENOUS | Status: AC
  Filled 2019-02-21: qty 100

## 2019-02-21 MED ORDER — ROCURONIUM BROMIDE 10 MG/ML (PF) SYRINGE
PREFILLED_SYRINGE | INTRAVENOUS | Status: AC
  Filled 2019-02-21: qty 10

## 2019-02-21 MED ORDER — ONDANSETRON HCL 4 MG/2ML IJ SOLN
INTRAMUSCULAR | Status: DC | PRN
  Administered 2019-02-21: 4 mg via INTRAVENOUS

## 2019-02-21 MED ORDER — HYDROMORPHONE HCL 2 MG/ML IJ SOLN
INTRAMUSCULAR | Status: AC
  Filled 2019-02-21: qty 1

## 2019-02-21 MED ORDER — SUCCINYLCHOLINE CHLORIDE 200 MG/10ML IV SOSY
PREFILLED_SYRINGE | INTRAVENOUS | Status: AC
  Filled 2019-02-21: qty 10

## 2019-02-21 MED ORDER — MIDAZOLAM HCL 2 MG/2ML IJ SOLN
INTRAMUSCULAR | Status: DC | PRN
  Administered 2019-02-21: 2 mg via INTRAVENOUS

## 2019-02-21 MED ORDER — FENTANYL CITRATE (PF) 250 MCG/5ML IJ SOLN
INTRAMUSCULAR | Status: AC
  Filled 2019-02-21: qty 5

## 2019-02-21 MED ORDER — CEFAZOLIN SODIUM-DEXTROSE 2-4 GM/100ML-% IV SOLN
2.0000 g | INTRAVENOUS | Status: AC
  Administered 2019-02-21: 2 g via INTRAVENOUS
  Filled 2019-02-21: qty 100

## 2019-02-21 MED ORDER — METOCLOPRAMIDE HCL 5 MG/ML IJ SOLN
10.0000 mg | Freq: Once | INTRAMUSCULAR | Status: DC | PRN
  Filled 2019-02-21: qty 2

## 2019-02-21 MED ORDER — ROCURONIUM BROMIDE 10 MG/ML (PF) SYRINGE
PREFILLED_SYRINGE | INTRAVENOUS | Status: AC
  Filled 2019-02-21: qty 20

## 2019-02-21 MED ORDER — PROPOFOL 10 MG/ML IV BOLUS
INTRAVENOUS | Status: DC | PRN
  Administered 2019-02-21: 180 mg via INTRAVENOUS

## 2019-02-21 MED ORDER — ONDANSETRON HCL 4 MG/2ML IJ SOLN
INTRAMUSCULAR | Status: AC
  Filled 2019-02-21: qty 2

## 2019-02-21 MED ORDER — VASOPRESSIN 20 UNIT/ML IV SOLN
INTRAVENOUS | Status: DC | PRN
  Administered 2019-02-21: 15:00:00 60 mL via INTRAMUSCULAR

## 2019-02-21 MED ORDER — HYDROMORPHONE HCL 1 MG/ML IJ SOLN
INTRAMUSCULAR | Status: DC | PRN
  Administered 2019-02-21 (×2): 1 mg via INTRAVENOUS

## 2019-02-21 MED ORDER — OXYCODONE-ACETAMINOPHEN 5-325 MG PO TABS
ORAL_TABLET | ORAL | Status: AC
  Filled 2019-02-21: qty 1

## 2019-02-21 MED ORDER — ACETAMINOPHEN 500 MG PO TABS
ORAL_TABLET | ORAL | Status: AC
  Filled 2019-02-21: qty 2

## 2019-02-21 MED ORDER — LACTATED RINGERS IV SOLN
INTRAVENOUS | Status: DC
  Administered 2019-02-21: 15:00:00 via INTRAVENOUS
  Administered 2019-02-21: 125 mL/h via INTRAVENOUS
  Filled 2019-02-21: qty 1000

## 2019-02-21 MED ORDER — LIDOCAINE 2% (20 MG/ML) 5 ML SYRINGE
INTRAMUSCULAR | Status: AC
  Filled 2019-02-21: qty 10

## 2019-02-21 MED ORDER — DEXAMETHASONE SODIUM PHOSPHATE 10 MG/ML IJ SOLN
INTRAMUSCULAR | Status: DC | PRN
  Administered 2019-02-21: 10 mg via INTRAVENOUS

## 2019-02-21 MED ORDER — KETAMINE HCL 10 MG/ML IJ SOLN
INTRAMUSCULAR | Status: AC
  Filled 2019-02-21: qty 1

## 2019-02-21 MED ORDER — DEXAMETHASONE SODIUM PHOSPHATE 10 MG/ML IJ SOLN
INTRAMUSCULAR | Status: AC
  Filled 2019-02-21: qty 1

## 2019-02-21 MED ORDER — ROCURONIUM BROMIDE 10 MG/ML (PF) SYRINGE
PREFILLED_SYRINGE | INTRAVENOUS | Status: DC | PRN
  Administered 2019-02-21: 20 mg via INTRAVENOUS
  Administered 2019-02-21: 5 mg via INTRAVENOUS
  Administered 2019-02-21: 45 mg via INTRAVENOUS

## 2019-02-21 MED ORDER — OXYCODONE HCL 5 MG PO TABS
5.0000 mg | ORAL_TABLET | Freq: Once | ORAL | Status: DC | PRN
  Filled 2019-02-21: qty 1

## 2019-02-21 SURGICAL SUPPLY — 64 items
BAG RETRIEVAL 10 (BASKET)
BLADE EXTENDED COATED 6.5IN (ELECTRODE) IMPLANT
CABLE HIGH FREQUENCY MONO STRZ (ELECTRODE) ×2 IMPLANT
CLEANER CAUTERY TIP 5X5 PAD (MISCELLANEOUS) ×1 IMPLANT
COVER MAYO STAND STRL (DRAPES) ×2 IMPLANT
COVER WAND RF STERILE (DRAPES) ×2 IMPLANT
DERMABOND ADVANCED (GAUZE/BANDAGES/DRESSINGS) ×1
DERMABOND ADVANCED .7 DNX12 (GAUZE/BANDAGES/DRESSINGS) ×1 IMPLANT
DRSG OPSITE POSTOP 3X4 (GAUZE/BANDAGES/DRESSINGS) IMPLANT
DRSG TEGADERM 4X4.75 (GAUZE/BANDAGES/DRESSINGS) IMPLANT
DRSG TELFA 3X8 NADH (GAUZE/BANDAGES/DRESSINGS) ×2 IMPLANT
DURAPREP 26ML APPLICATOR (WOUND CARE) ×2 IMPLANT
ELECT NDL TIP 2.8 STRL (NEEDLE) IMPLANT
ELECT NEEDLE TIP 2.8 STRL (NEEDLE) IMPLANT
ELECT REM PT RETURN 9FT ADLT (ELECTROSURGICAL) ×2
ELECTRODE REM PT RTRN 9FT ADLT (ELECTROSURGICAL) ×1 IMPLANT
GAUZE 4X4 16PLY RFD (DISPOSABLE) ×3 IMPLANT
GLOVE BIO SURGEON STRL SZ8 (GLOVE) ×4 IMPLANT
GOWN STRL REUS W/TWL XL LVL3 (GOWN DISPOSABLE) ×4 IMPLANT
HOLDER FOLEY CATH W/STRAP (MISCELLANEOUS) IMPLANT
KIT TURNOVER CYSTO (KITS) ×2 IMPLANT
MANIPULATOR UTERINE 4.5 ZUMI (MISCELLANEOUS) ×2 IMPLANT
NDL SAFETY ECLIPSE 18X1.5 (NEEDLE) IMPLANT
NEEDLE HYPO 18GX1.5 SHARP (NEEDLE)
NEEDLE HYPO 22GX1.5 SAFETY (NEEDLE) ×4 IMPLANT
NEEDLE INSUFFLATION 120MM (ENDOMECHANICALS) ×2 IMPLANT
NS IRRIG 500ML POUR BTL (IV SOLUTION) ×2 IMPLANT
PACK LAPAROSCOPY BASIN (CUSTOM PROCEDURE TRAY) ×2 IMPLANT
PACK TRENDGUARD 450 HYBRID PRO (MISCELLANEOUS) IMPLANT
PAD CLEANER CAUTERY TIP 5X5 (MISCELLANEOUS) ×1
PAD DRESSING TELFA 3X8 NADH (GAUZE/BANDAGES/DRESSINGS) ×1 IMPLANT
PAD OB MATERNITY 4.3X12.25 (PERSONAL CARE ITEMS) ×2 IMPLANT
PENCIL BUTTON HOLSTER BLD 10FT (ELECTRODE) ×2 IMPLANT
SEPRAFILM MEMBRANE 5X6 (MISCELLANEOUS) ×2 IMPLANT
SET IRRIG TUBING LAPAROSCOPIC (IRRIGATION / IRRIGATOR) ×2 IMPLANT
SET TRI-LUMEN FLTR TB AIRSEAL (TUBING) IMPLANT
SHEARS HARMONIC ACE PLUS 36CM (ENDOMECHANICALS) IMPLANT
SPONGE LAP 18X18 RF (DISPOSABLE) IMPLANT
STOPCOCK 4 WAY LG BORE MALE ST (IV SETS) IMPLANT
SUT MNCRL AB 4-0 PS2 18 (SUTURE) ×2 IMPLANT
SUT PLAIN 2 0 XLH (SUTURE) ×1 IMPLANT
SUT VIC AB 0 CT1 36 (SUTURE) IMPLANT
SUT VIC AB 2-0 CT1 (SUTURE) ×5 IMPLANT
SUT VIC AB 2-0 CT2 27 (SUTURE) IMPLANT
SUT VIC AB 2-0 UR6 27 (SUTURE) IMPLANT
SUT VIC AB 4-0 SH 27 (SUTURE) ×1
SUT VIC AB 4-0 SH 27XANBCTRL (SUTURE) ×2 IMPLANT
SYR 30ML LL (SYRINGE) ×2 IMPLANT
SYR 50ML LL SCALE MARK (SYRINGE) IMPLANT
SYR 5ML LL (SYRINGE) ×2 IMPLANT
SYR CONTROL 10ML LL (SYRINGE) ×4 IMPLANT
SYS BAG RETRIEVAL 10MM (BASKET)
SYS LAPSCP GELPORT 120MM (MISCELLANEOUS) ×2
SYSTEM BAG RETRIEVAL 10MM (BASKET) IMPLANT
SYSTEM LAPSCP GELPORT 120MM (MISCELLANEOUS) IMPLANT
TOWEL OR 17X26 10 PK STRL BLUE (TOWEL DISPOSABLE) ×3 IMPLANT
TRAY FOLEY W/BAG SLVR 14FR (SET/KITS/TRAYS/PACK) ×2 IMPLANT
TRENDGUARD 450 HYBRID PRO PACK (MISCELLANEOUS) ×2
TROCAR OPTI TIP 5M 100M (ENDOMECHANICALS) ×4 IMPLANT
TROCAR PORT AIRSEAL 5X120 (TROCAR) IMPLANT
TROCAR XCEL DIL TIP R 11M (ENDOMECHANICALS) ×1 IMPLANT
TUBING EVAC SMOKE HEATED PNEUM (TUBING) IMPLANT
WARMER LAPAROSCOPE (MISCELLANEOUS) ×2 IMPLANT
WATER STERILE IRR 500ML POUR (IV SOLUTION) ×2 IMPLANT

## 2019-02-21 NOTE — Anesthesia Preprocedure Evaluation (Addendum)
Anesthesia Evaluation  Patient identified by MRN, date of birth, ID band Patient awake    Reviewed: Allergy & Precautions, NPO status , Patient's Chart, lab work & pertinent test results  Airway Mallampati: II  TM Distance: >3 FB Neck ROM: Full    Dental no notable dental hx. (+) Teeth Intact, Dental Advisory Given   Pulmonary    Pulmonary exam normal breath sounds clear to auscultation       Cardiovascular negative cardio ROS Normal cardiovascular exam Rhythm:Regular Rate:Normal     Neuro/Psych Seizures -, Well Controlled,  PSYCHIATRIC DISORDERS Anxiety    GI/Hepatic negative GI ROS, Neg liver ROS, Hx/o Gastric bypass   Endo/Other  negative endocrine ROS  Renal/GU negative Renal ROS  negative genitourinary   Musculoskeletal  (+) Arthritis , SLE   Abdominal   Peds  Hematology  (+) anemia ,   Anesthesia Other Findings   Reproductive/Obstetrics                           Anesthesia Physical Anesthesia Plan  ASA: II  Anesthesia Plan: General   Post-op Pain Management:    Induction: Intravenous  PONV Risk Score and Plan: 4 or greater and Midazolam, Ondansetron, Dexamethasone, Treatment may vary due to age or medical condition and Scopolamine patch - Pre-op  Airway Management Planned: Oral ETT  Additional Equipment:   Intra-op Plan:   Post-operative Plan: Extubation in OR  Informed Consent: I have reviewed the patients History and Physical, chart, labs and discussed the procedure including the risks, benefits and alternatives for the proposed anesthesia with the patient or authorized representative who has indicated his/her understanding and acceptance.     Dental advisory given  Plan Discussed with: CRNA and Surgeon  Anesthesia Plan Comments:         Anesthesia Quick Evaluation

## 2019-02-21 NOTE — OR Nursing (Signed)
Pt states OR table is uncomfortable to her back. Positioned pt in stirrups, pt states this position is more comfortable for her.

## 2019-02-21 NOTE — Transfer of Care (Signed)
Immediate Anesthesia Transfer of Care Note  Patient: Taniyah Sevey  Procedure(s) Performed: LAPAROSCOPIC GELPORT ASSISTED MYOMECTOMY, EXCISION OF ENDOMETRIOSIS, LYSIS OF ADHESIONS, CHROMOTUBERATION (N/A Abdomen)  Patient Location: PACU  Anesthesia Type:General  Level of Consciousness: awake, alert , oriented and patient cooperative  Airway & Oxygen Therapy: Patient Spontanous Breathing and Patient connected to nasal cannula oxygen  Post-op Assessment: Report given to RN and Post -op Vital signs reviewed and stable  Post vital signs: Reviewed and stable  Last Vitals:  Vitals Value Taken Time  BP    Temp    Pulse    Resp    SpO2      Last Pain:  Vitals:   02/21/19 1145  TempSrc:   PainSc: 2       Patients Stated Pain Goal: 5 (08/81/10 3159)  Complications: No apparent anesthesia complications

## 2019-02-21 NOTE — Discharge Instructions (Signed)
Post Anesthesia Home Care Instructions  Activity: Get plenty of rest for the remainder of the day. A responsible individual must stay with you for 24 hours following the procedure.  For the next 24 hours, DO NOT: -Drive a car -Paediatric nurse -Drink alcoholic beverages -Take any medication unless instructed by your physician -Make any legal decisions or sign important papers.  Meals: Start with liquid foods such as gelatin or soup. Progress to regular foods as tolerated. Avoid greasy, spicy, heavy foods. If nausea and/or vomiting occur, drink only clear liquids until the nausea and/or vomiting subsides. Call your physician if vomiting continues.  Special Instructions/Symptoms: Your throat may feel dry or sore from the anesthesia or the breathing tube placed in your throat during surgery. If this causes discomfort, gargle with warm salt water. The discomfort should disappear within 24 hours.  If you had a scopolamine patch placed behind your ear for the management of post- operative nausea and/or vomiting:  1. The medication in the patch is effective for 72 hours, after which it should be removed.  Wrap patch in a tissue and discard in the trash. Wash hands thoroughly with soap and water. 2. You may remove the patch earlier than 72 hours if you experience unpleasant side effects which may include dry mouth, dizziness or visual disturbances. 3. Avoid touching the patch. Wash your hands with soap and water after contact with the patch.  DISCHARGE INSTRUCTIONS: Laparoscopy  The following instructions have been prepared to help you care for yourself upon your return home today.  Wound care:  Do not get the incision wet for the first 24 hours. The incision should be kept clean and dry.  The Band-Aids or dressings may be removed the day after surgery.  Should the incision become sore, red, and swollen after the first week, check with your doctor.  Personal hygiene:  Shower the day  after your procedure.  Activity and limitations:  Do NOT drive or operate any equipment today.  Do NOT lift anything more than 15 pounds for 2-3 weeks after surgery.  Do NOT rest in bed all day.  Walking is encouraged. Walk each day, starting slowly with 5-minute walks 3 or 4 times a day. Slowly increase the length of your walks.  Walk up and down stairs slowly.  Do NOT do strenuous activities, such as golfing, playing tennis, bowling, running, biking, weight lifting, gardening, mowing, or vacuuming for 2-4 weeks. Ask your doctor when it is okay to start.  Diet: Eat a light meal as desired this evening. You may resume your usual diet tomorrow.  Return to work: This is dependent on the type of work you do. For the most part you can return to a desk job within a week of surgery. If you are more active at work, please discuss this with your doctor.  What to expect after your surgery: You may have a slight burning sensation when you urinate on the first day. You may have a very small amount of blood in the urine. Expect to have a small amount of vaginal discharge/light bleeding for 1-2 weeks. It is not unusual to have abdominal soreness and bruising for up to 2 weeks. You may be tired and need more rest for about 1 week. You may experience shoulder pain for 24-72 hours. Lying flat in bed may relieve it.  Call your doctor for any of the following:  Develop a fever of 100.4 or greater  Inability to urinate 6 hours after discharge from  hospital  Severe pain not relieved by pain medications  Persistent of heavy bleeding at incision site  Redness or swelling around incision site after a week  Increasing nausea or vomiting  Myomectomy, Care After This sheet gives you information about how to care for yourself after your procedure. Your health care provider may also give you more specific instructions. If you have problems or questions, contact your health care provider. What can I expect  after the procedure? After the procedure, it is common to have:  Pain in your abdomen, especially at the incision areas. You will be given pain medicine to control the pain.  Tiredness. This is a normal part of the recovery process. Your energy level will return to normal over the coming weeks.  Vaginal bleeding. This is normal and will stop in the coming weeks.  Constipation. Recovery time from this procedure will depend on the type of procedure you had and your general overall health prior to the procedure. Follow these instructions at home: Medicines  Take over-the-counter and prescription medicines only as told by your health care provider.  Do not take aspirin because it can cause bleeding.  If you were prescribed an antibiotic medicine, use it as told by your health care provider. Do not stop using the antibiotic even if you start to feel better.  Do not drive or use heavy machinery while taking prescription pain medicine.  Do not drink alcohol while taking prescription pain medicine. Incision care   Follow instructions from your health care provider about how to take care of any incisions. Make sure you: ? Wash your hands with soap and water before you change your bandage (dressing). If soap and water are not available, use hand sanitizer. ? Change your dressing as told by your health care provider. ? Leave stitches (sutures), skin glue, or adhesive strips in place. These skin closures may need to stay in place for 2 weeks or longer. If adhesive strip edges start to loosen and curl up, you may trim the loose edges. Do not remove adhesive strips completely unless your health care provider tells you to do that.  Check your incision areas every day for signs of infection. Check for: ? Redness, swelling, or pain. ? Fluid or blood. ? Warmth. ? Pus or a bad smell.  Do not take baths, swim, or use a hot tub until your health care provider approves. Take showers as directed by  your health care provider. Activity  Return to your normal activities as told by your health care provider. Ask your health care provider what activities are safe for you.  Do not do activities that require a lot of effort until your health care provider says it is okay.  Do not lift anything that is heavier than 15 lb (6.8 kg) until your health care provider says that it is safe.  Do not douche, use tampons, or have sexual intercourse until your health care provider approves.  Walk daily but take frequent rest breaks if you tire easily.  Continue to practice deep breathing and coughing. If it hurts to cough, try holding a pillow against your belly as you cough.  Do not drive until your health care provider approves. General instructions  To prevent or treat constipation while you are taking prescription pain medicine, your health care provider may recommend that you: ? Drink enough fluid to keep your urine clear or pale yellow. ? Take over-the-counter or prescription medicines. ? Eat foods that are high in fiber,  such as fresh fruits and vegetables, whole grains, and beans. ? Limit foods that are high in fat and processed sugars, such as fried and sweet foods.  Take your temperature twice a day and write it down. If you develop a fever, this may be a sign that you have an infection.  Do not drink alcohol.  Have someone help you at home for 1 week or until you can do your own household activities.  Keep all follow-up visits as told by your health care provider. This is important. Contact a health care provider if:  You have a fever.  You have increasing abdominal pain that is not relieved with medicine.  You have nausea, vomiting, or diarrhea.  You have pain when you urinate or you have blood in your urine.  You have a rash on your body.  You have pain or redness where your IV access tube was inserted.  You have redness, swelling, or pain around an incision.  You have  fluid or blood coming from an incision.  An incision feels warm to the touch.  You have pus or a bad smell coming from an incision. Get help right away if:  You have weakness or light-headedness.  You have pain, swelling, or redness in your legs.  You have chest pain.  You faint.  You have shortness of breath.  You have heavy vaginal bleeding.  You have an incision that is opening up. Summary  Recovery time from this procedure will depend on the type of procedure you had and your general overall health prior to the procedure.  If you were prescribed an antibiotic medicine, use it as told by your health care provider. Do not stop using the antibiotic even if you start to feel better.  Do not douche, use tampons, or have sexual intercourse until your health care provider approves.  Return to your normal activities as told by your health care provider. Ask your health care provider what activities are safe for you. This information is not intended to replace advice given to you by your health care provider. Make sure you discuss any questions you have with your health care provider. Document Released: 12/31/2010 Document Revised: 07/23/2017 Document Reviewed: 09/10/2016 Elsevier Patient Education  2020 [employer].

## 2019-02-21 NOTE — H&P (Addendum)
Kathleen Ashley is a 38 y.o. female , originally referred to me by Dr. Murrell Redden, for L/S Gelport assisted myomectomy.  She was diagnosed with fibroids because of abnormal uterine bleeding and was placed on progestins.  She developed breakthrough bleeding and had to stop the pills.  She has been having monthly periods but with heavy flow and prolonged duration.  Transvaginal ultrasound showed a posterior right-sided intramural myoma measuring 8.9 x 5.6 cm.  She has been suppressed under my treatment with danazol 200 mg twice daily and letrozole 2.5 mg daily.  Patient would like to preserve her childbearing potential.  Pertinent Gynecological History: Menses: flow is excessive with use of 3 pads or tampons on heaviest days Bleeding: dysfunctional uterine bleeding Contraception: none DES exposure: denies Blood transfusions: none Sexually transmitted diseases: no past history Last pap: normal     Menstrual History: Menarche age: 44 No LMP recorded.    Past Medical History:  Diagnosis Date  . DDD (degenerative disc disease)   . Depression   . Low back pain   . Lupus ([place])   . Lupus (systemic lupus erythematosus) ([place])   . Seizure ([place])    last seizure was  11-03-18, previous to that  seizures were in childhood. per ER physician note, otc cold medication                      Past Surgical History:  Procedure Laterality Date  . ABDOMINAL EXPOSURE N/A 10/08/2017   Procedure: ABDOMINAL EXPOSURE;  Surgeon: Rosetta Posner, MD;  Location: [facility] OR;  Service: Vascular;  Laterality: N/A;  . abdominal surgery     "tummy tuck"  . ANTERIOR LUMBAR FUSION N/A 10/08/2017   Procedure: Anterior Lumbar Interbody Fusion  - Lumbar five Sacral one;  Surgeon: Eustace Moore, MD;  Location: [facility];  Service: Neurosurgery;  Laterality: N/A;  . BACK SURGERY    . BREAST SURGERY     breast reduction  . GASTRIC BYPASS    . LUMBAR LAMINECTOMY     L5-S1 on the right             Family History  Problem Relation  Age of Onset  . Diabetes Sister    No hereditary disease.  No cancer of breast, ovary, uterus. No cutaneous leiomyomatosis or renal cell carcinoma.  Social History   Socioeconomic History  . Marital status: Married    Spouse name: Not on file  . Number of children: Not on file  . Years of education: Not on file  . Highest education level: Not on file  Occupational History  . Not on file  Social Needs  . Financial resource strain: Not on file  . Food insecurity    Worry: Not on file    Inability: Not on file  . Transportation needs    Medical: Not on file    Non-medical: Not on file  Tobacco Use  . Smoking status: Never Smoker  . Smokeless tobacco: Never Used  Substance and Sexual Activity  . Alcohol use: No  . Drug use: No  . Sexual activity: Yes  Lifestyle  . Physical activity    Days per week: Not on file    Minutes per session: Not on file  . Stress: Not on file  Relationships  . Social Herbalist on phone: Not on file    Gets together: Not on file    Attends religious service: Not on file    Active  member of club or organization: Not on file    Attends meetings of clubs or organizations: Not on file    Relationship status: Not on file  . Intimate partner violence    Fear of current or ex partner: Not on file    Emotionally abused: Not on file    Physically abused: Not on file    Forced sexual activity: Not on file  Other Topics Concern  . Not on file  Social History Narrative   ** Merged History Encounter **        Allergies  Allergen Reactions  . Buspirone     Causes severe migraines    . Celebrex [Celecoxib] Other (See Comments)    Headache    No current facility-administered medications on file prior to encounter.    Current Outpatient Medications on File Prior to Encounter  Medication Sig Dispense Refill  . buprenorphine-naloxone (SUBOXONE) 2-0.5 mg SUBL SL tablet Place 2 tablets under the tongue daily. Takes one in the AM and one  in the PM    . busPIRone (BUSPAR) 5 MG tablet Take 1 tablet (5 mg total) by mouth 2 (two) times daily as needed (anxiety). 60 tablet 2  . [employer] TRIPLE MAGNESIUM COMPLEX PO Take 1 tablet by mouth daily.    . cyclobenzaprine (FLEXERIL) 10 MG tablet Take 1 tablet (10 mg total) by mouth daily. (Patient taking differently: Take 10 mg by mouth 2 (two) times a day. ) 90 tablet 3  . danazol (DANOCRINE) 200 MG capsule Take 200 mg by mouth daily.    . hydroxychloroquine (PLAQUENIL) 200 MG tablet Take 200 mg by mouth daily.     Marland Kitchen ibuprofen (ADVIL,MOTRIN) 800 MG tablet Take 1 tablet (800 mg total) by mouth 3 (three) times daily. (Patient taking differently: Take 800 mg by mouth every 8 (eight) hours as needed for headache or moderate pain. ) 21 tablet 0  . letrozole (FEMARA) 2.5 MG tablet Take 2.5 mg by mouth daily.    . Multiple Vitamin (MULTIVITAMIN WITH MINERALS) TABS tablet Take 1 tablet by mouth daily. Reported on 02/11/2016    . Omega-3 Fatty Acids (FISH OIL) 1000 MG CAPS Take 1,000 mg by mouth daily.     Marland Kitchen OVER THE COUNTER MEDICATION Take 1 tablet by mouth daily. Adrenal Support    . predniSONE (DELTASONE) 5 MG tablet Take 1 tablet (5 mg total) by mouth daily with breakfast. Take 2 pills daily for 1 week then resume 1 pill daily. 30 tablet 0  . THEANINE PO Take 200 mg by mouth daily.    . traMADol (ULTRAM) 50 MG tablet TAKE 1 TABLET(50 MG) BY MOUTH EVERY 12 HOURS AS NEEDED (Patient taking differently: Take 50 mg by mouth every 12 (twelve) hours as needed for moderate pain. ) 60 tablet 5  . TURMERIC PO Take 1,000 mg by mouth daily.     . lansoprazole (PREVACID) 30 MG capsule Take 1 capsule (30 mg total) by mouth daily. (Patient taking differently: Take 30 mg by mouth 2 (two) times daily. ) 30 capsule 0  . predniSONE (DELTASONE) 20 MG tablet Take 1 tablet (20 mg total) by mouth 3 (three) times daily. (Patient not taking: Reported on 02/14/2019) 21 tablet 0     Review of Systems  Constitutional: Negative.    HENT: Negative.   Eyes: Negative.   Respiratory: Negative.   Cardiovascular: Negative.   Gastrointestinal: Negative.   Genitourinary: Negative.   Musculoskeletal: Negative.   Skin: Negative.   Neurological:  Negative.   Endo/Heme/Allergies: Negative.   Psychiatric/Behavioral: Negative.      Physical Exam  BP 129/81   Pulse 99   Temp 97.7 F (36.5 C) (Oral)   Resp 14   Wt 75.2 kg   LMP 02/20/2019 (Exact Date)   SpO2 100%   BMI 26.76 kg/m  Constitutional: She is oriented to person, place, and time. She appears well-developed and well-nourished.  HENT:  Head: Normocephalic and atraumatic.  Nose: Nose normal.  Mouth/Throat: Oropharynx is clear and moist. No oropharyngeal exudate.  Eyes: Conjunctivae normal and EOM are normal. Pupils are equal, round, and reactive to light. No scleral icterus.  Neck: Normal range of motion. Neck supple. No tracheal deviation present. No thyromegaly present.  Cardiovascular: Normal rate.   Respiratory: Effort normal and breath sounds normal.  GI: Soft. Bowel sounds are normal. She exhibits no distension and no mass. There is no tenderness.  Lymphadenopathy:    She has no cervical adenopathy.  Neurological: She is alert and oriented to person, place, and time. She has normal reflexes.  Skin: Skin is warm.  Psychiatric: She has a normal mood and affect. Her behavior is normal. Judgment and thought content normal.    Assessment/Plan:  Intramural uterine myoma, causing menorrhagia and pressure sensation. Preoperative for robot assisted myomectomy Benefits and risks of laparoscopic GelPort assisted myomectomy were discussed with the patient and her family member again.  Bowel prep instructions were given.  All of patient's questions were answered.  She verbalized understanding.  She knows that she will need a cesarean delivery for future pregnancies, and that it is recommended she does not conceive for 2-3 months for uterus to heal.

## 2019-02-21 NOTE — Op Note (Addendum)
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 endotracheal  Complications: None  Estimated blood loss: 100 mL  Specimens: Uterine myoma (240 g), left ovarian fossa and uterine serosa lesions to pathology  Findings: On examination under anesthesia, external genitalia, Bartholin's, Skene's, and urethra were normal. The vagina was normal. The cervix was nulliparous and appeared grossly normal. The uterus was retroflexed, 12-13 week size, firm and mobile with irregularities caused by myomas. It sounded to 8.7 cm.  On laparoscopy, upper abdomen, liver surface and diaphragm surfaces were normal. Gallbladder was normal. The appendix was not visualized. The uterus contained a 9 cm right anterior fundal intramural/subserosal (FIGO type III-VII) myoma with the posterior-lower aspect of it attached to the endometrium. There were also small less than 0.3 cm anterior subserosal myomas which were not removed. There was a brown lesion of endometriosis on the posterior uterine serosa which was also excised. The left tube and ovary appeared normal. The right ovary appeared normal. 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 Both fallopian tubes had fimbria rated at 5 out of 5.  During chromotubation both of them appeared patent. There were stellate lesions of fibrosis in the left ovarian fossa which were excised and the surrounding satellite lesions were ablated with needle tip electrode.  Description of the procedure: The patient was placed in dorsal supine position and general endotracheal anesthesia was given. 2 g of cefazolin were given intravenously for  prophylaxis. Patient was placed in lithotomy position. She was prepped and draped in sterile manner.    A Foley catheter was inserted into the bladder. A ZUMI catheter was placed into the uterine cavity. This was connected to a syringe containing diluted methylene blue solution which was used to define the endometrium during the myomectomy. The uterus sounded to 8.7 cm. The surgeon was regloved and a surgical field was created on the abdomen.  After preemptive anesthesia of all surgical sites with 0.5% bupivacaine, a 5 mm intraumbilical skin incision was made and a Verress needle was inserted. Its correct location was confirmed. A pneumoperitoneum was created with carbon dioxide.  5 mm laparoscope with a 30 lens was inserted and video laparoscopy was started . A left lower quadrant 5 mm and a right lower quadrant  5 mm incisions were made and ancillary trochars were placed under direct visualization. Above findings were noted. We first separated the adhesions between the sigmoid mesentery and the pelvic brim in order to expose the left adnexal area better, using needle electrode at a setting of 25 W cutting. Needle electrode at a setting of 61 W cutting was used to excise the posterior uterine serosa lesion as well as left ovarian fossa lesions after performing hydrodissection with the suction/irrigator.  Surrounding satellite lesions of fibrosis in the left ovarian fossa were also ablated with the needle tip electrode. A dilute solution of vasopressin (0.4 units per mL) was injected into the myometrium overlying the fundal myoma, until the myometrium blanched. A needle electrode with 52 W of cutting current  was used to make a longitudinal incision on the myometrium overlying the fundal 9 cm myoma. The myoma was grasped with tenaculum and dissection was started.  We then made a 4 cm transverse suprapubic incision to insert a GelPort.  After dissection of the anatomic layers, the peritoneal cavity was  entered. A GelPort was placed and the rest of the case was performed either using this port as a laparoscopic port or as a minilaparotomy port.  The rest of the myoma had to be in situ morcellated to eventually take it out of this 4 cm incision. This was carried out by blunt and sharp dissection and in situ morcellation was performed with #10 blade.  Chromotubation was performed to ensure that endometrial cavity was not entered.  The redundant myometrium covering the subserosal aspect of the large myoma was trimmed off.  We put 2 layers of pursestring sutures into the deepest portion of the defect created after the myomectomy, using 2-0 Vicryl suture.  The rest of the myoma defect was closed in 3 layers: The first layer was a deep myometrial suture of 2-0 Vicryl continuous interlocking suture, the second layer was a superficial myometrial layer of 2-0 Vicryl continuous suture. A 4-0 Vicryl continuous suture was placed on the serosa and the most superficial myometrium.  The suprapubic fascial incision was closed with 2-0 Vicryl continuous suture. Subcutaneous tissue was irrigated and aspirated good hemostasis was achieved.  2-0 catgut interrupted sutures were placed in the thickened subcutaneous tissue. The abdomen and the pelvis was carefully inspected under laparoscopic visualization and the pelvis was copiously irrigated and aspirated. A slurry of 2 sheets of Seprafilm in 60 mL of normal saline was injected as an adhesion barrier into the pelvis. The gas was allowed to escape. The instrument and the lap pad count were correct. The trochars were removed. The skin incision belonging to the 4 cm GelPort site was closed with 4-0 Monocryl in subcuticular sutures.  Rest of the port sites were approximated with Dermabond.  The patient tolerated the procedure well and was transferred to recovery room in satisfactory condition.  SPECIAL NOTE: Because of the extent of the myometrial incision during the uterine  myomectomy, it is recommended that this patient deliver by a cesarean section with her future pregnancies.  Governor Specking, MD

## 2019-02-21 NOTE — Anesthesia Procedure Notes (Addendum)
Procedure Name: Intubation Date/Time: 02/21/2019 1:45 PM Performed by: Wanita Chamberlain, CRNA Pre-anesthesia Checklist: Timeout performed, Patient being monitored, Suction available, Emergency Drugs available and Patient identified Patient Re-evaluated:Patient Re-evaluated prior to induction Oxygen Delivery Method: Circle system utilized Preoxygenation: Pre-oxygenation with 100% oxygen Induction Type: IV induction Ventilation: Mask ventilation without difficulty Laryngoscope Size: Mac and 3 Grade View: Grade I Tube type: Oral Tube size: 7.0 mm Number of attempts: 1 Airway Equipment and Method: Stylet Placement Confirmation: breath sounds checked- equal and bilateral,  CO2 detector,  positive ETCO2 and ETT inserted through vocal cords under direct vision Secured at: 21 cm Tube secured with: Tape Dental Injury: Teeth and Oropharynx as per pre-operative assessment

## 2019-02-22 NOTE — Anesthesia Postprocedure Evaluation (Signed)
Anesthesia Post Note  Patient: Rori Pugmire  Procedure(s) Performed: LAPAROSCOPIC GELPORT ASSISTED MYOMECTOMY, EXCISION OF ENDOMETRIOSIS, LYSIS OF ADHESIONS, CHROMOTUBERATION (N/A Abdomen)     Patient location during evaluation: PACU Anesthesia Type: General Level of consciousness: awake and alert Pain management: pain level controlled Vital Signs Assessment: post-procedure vital signs reviewed and stable Respiratory status: spontaneous breathing, nonlabored ventilation, respiratory function stable and patient connected to nasal cannula oxygen Cardiovascular status: blood pressure returned to baseline and stable Postop Assessment: no apparent nausea or vomiting Anesthetic complications: no    Last Vitals:  Vitals:   02/21/19 1659 02/21/19 1700  BP: 126/82 126/82  Pulse: 77 72  Resp: 15 10  Temp:    SpO2: 98% 100%    Last Pain:  Vitals:   02/21/19 1815  TempSrc:   PainSc: 4                  Marquavious Nazar

## 2019-02-23 ENCOUNTER — Encounter (HOSPITAL_BASED_OUTPATIENT_CLINIC_OR_DEPARTMENT_OTHER): Payer: Self-pay | Admitting: Obstetrics and Gynecology

## 2019-03-09 DIAGNOSIS — Z6824 Body mass index (BMI) 24.0-24.9, adult: Secondary | ICD-10-CM | POA: Diagnosis not present

## 2019-03-09 DIAGNOSIS — Z01419 Encounter for gynecological examination (general) (routine) without abnormal findings: Secondary | ICD-10-CM | POA: Diagnosis not present

## 2019-03-13 DIAGNOSIS — Z79899 Other long term (current) drug therapy: Secondary | ICD-10-CM | POA: Diagnosis not present

## 2019-04-10 DIAGNOSIS — F1121 Opioid dependence, in remission: Secondary | ICD-10-CM | POA: Diagnosis not present

## 2019-04-10 DIAGNOSIS — Z79899 Other long term (current) drug therapy: Secondary | ICD-10-CM | POA: Diagnosis not present

## 2019-05-02 IMAGING — MR MRI HEAD WITHOUT AND WITH CONTRAST
9 of 13 series · 25 of 48 positions shown · IV contrast (gadavist)
Comparison: None.

CLINICAL DATA: History lupus.  Acute presentation with seizure.

EXAM:
MRI HEAD WITHOUT AND WITH CONTRAST
TECHNIQUE: Multiplanar, multiecho pulse sequences of the brain and surrounding
structures were obtained without and with intravenous contrast.
CONTRAST:  7.5 cc Gadavist

[Series 4: DWI · axial · 5.0mm · 1.09mm/px · z∈[-60,+84]mm · 6 of 60 slices shown (1 of 2)]
[im 1/60]
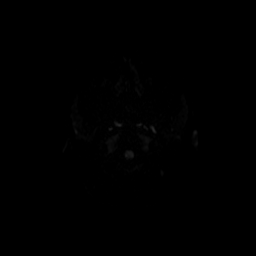
[im 12/60]
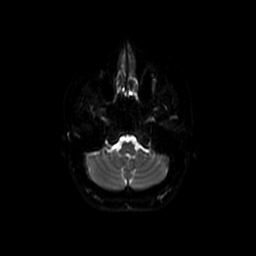
[im 24/60]
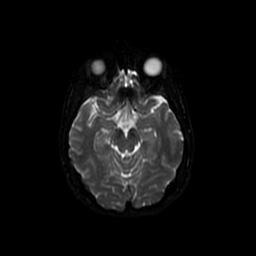
[im 36/60]
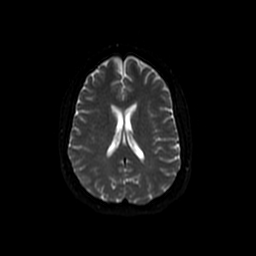
[im 48/60]
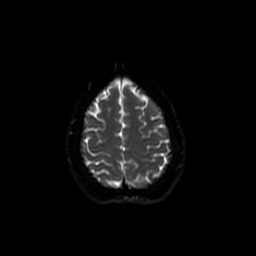
[im 60/60]
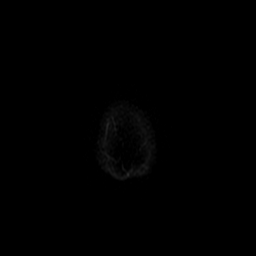

[Series 5: T2 · axial · 5.0mm · 0.43mm/px · z∈[-60,+86]mm · 2 of 22 slices shown (1 of 2)]
[im 1/22]
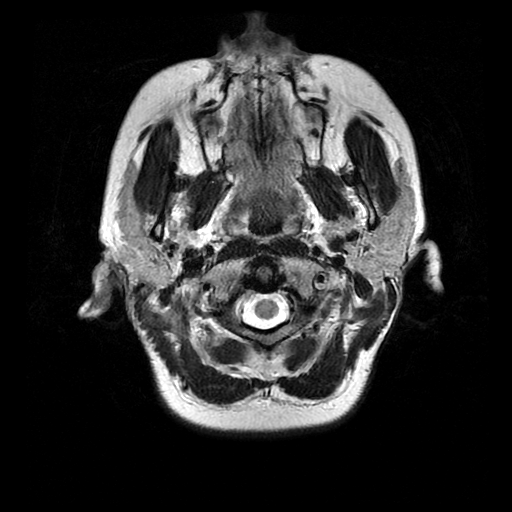
[im 22/22]
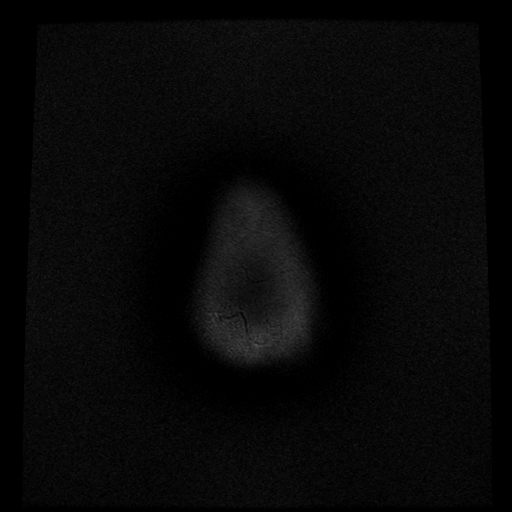

[Series 6: FLAIR · axial · 5.0mm · 0.43mm/px · z∈[-60,+86]mm · 2 of 22 slices shown (1 of 3)]
[im 1/22]
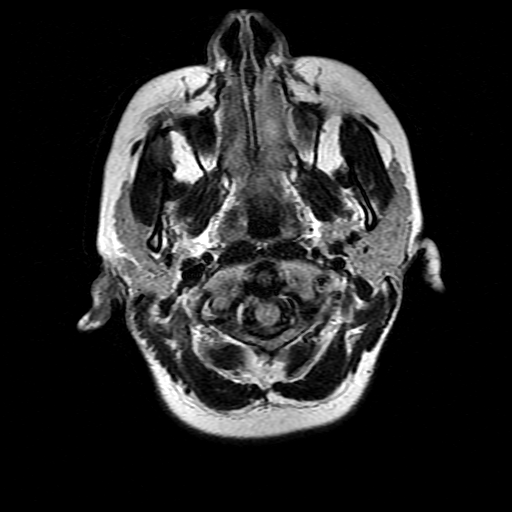
[im 22/22]
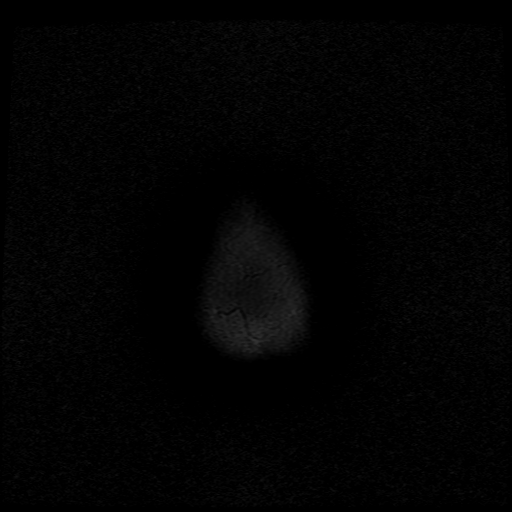

[Series 9: T2 · coronal · 3.0mm · 0.35mm/px · 3 of 31 slices shown (2 of 2)]
[im 1/31]
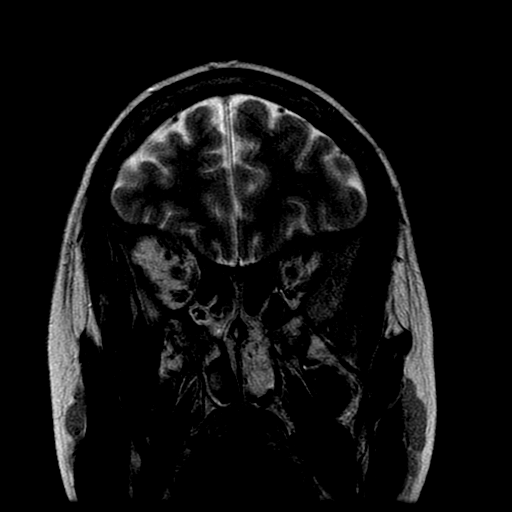
[im 16/31]
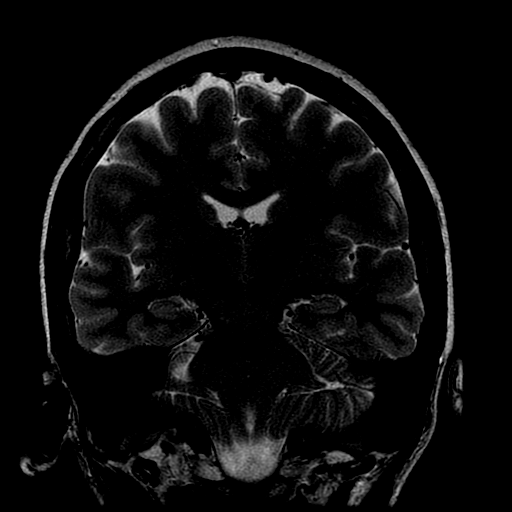
[im 31/31]
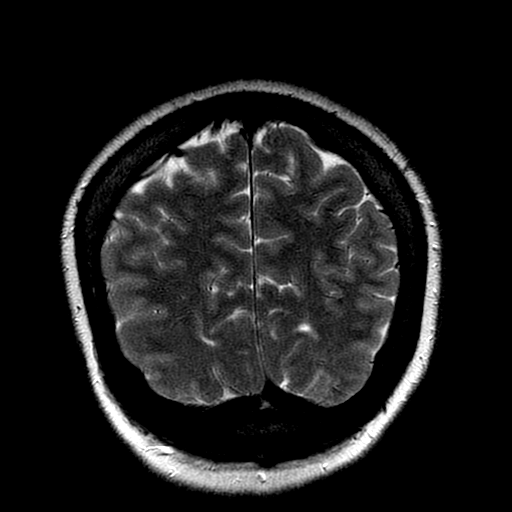

[Series 10: FLAIR · coronal · 3.0mm · 0.35mm/px · 2 of 16 slices shown (2 of 3)]
[im 1/16]
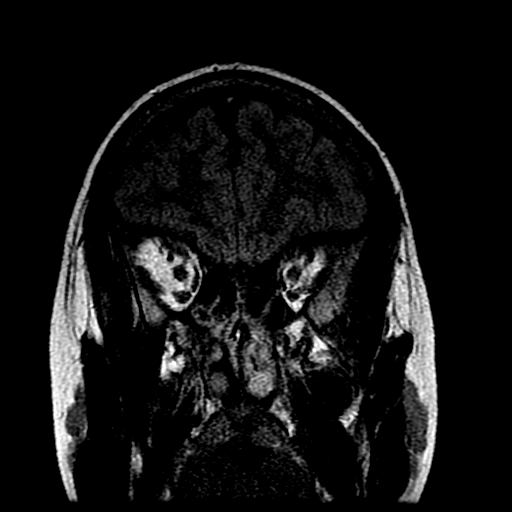
[im 16/16]
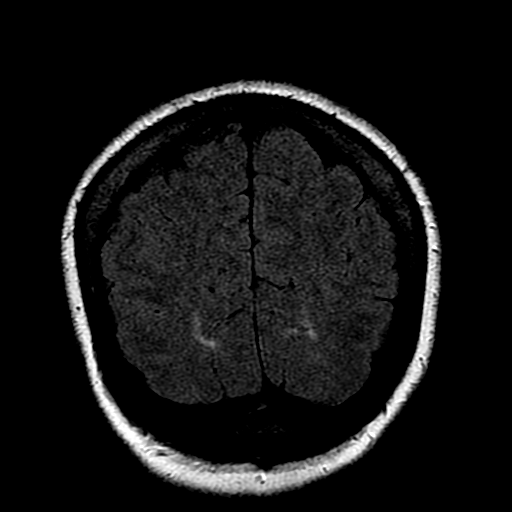

[Series 11: FLAIR · coronal · 3.0mm · 0.35mm/px · 3 of 31 slices shown (3 of 3)]
[im 1/31]
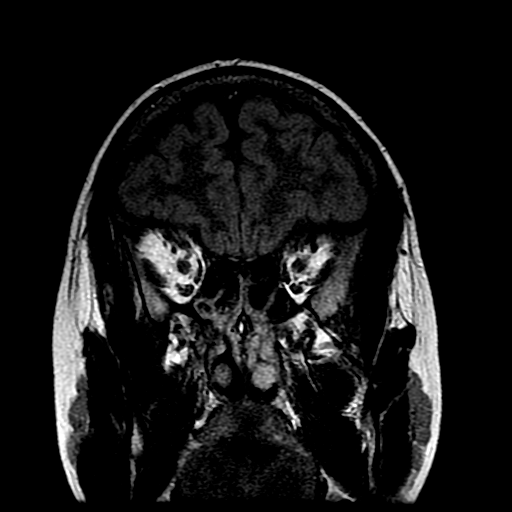
[im 16/31]
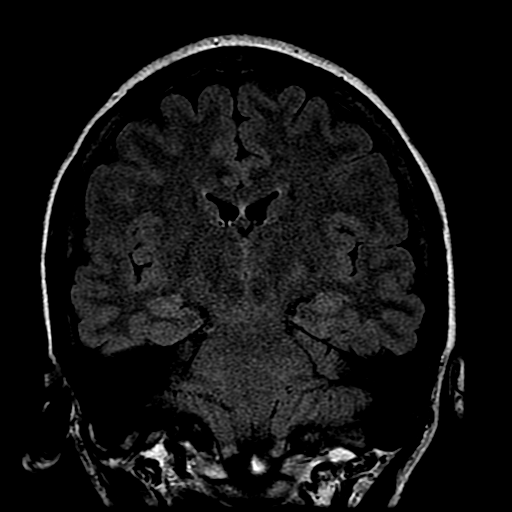
[im 31/31]
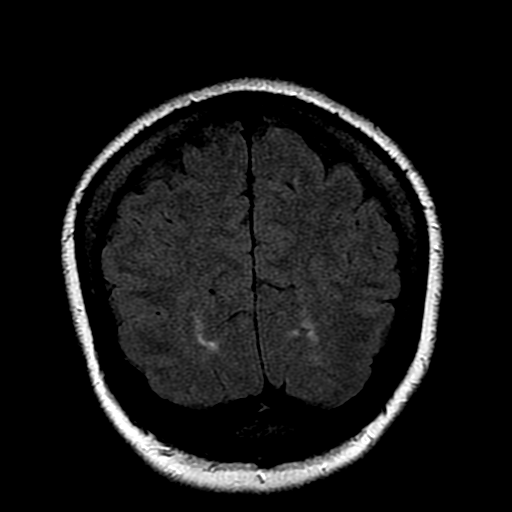

[Series 12: T2 post-contrast · coronal · 5.0mm · 0.39mm/px · 1 of 28 slices shown]
[im 1/28]
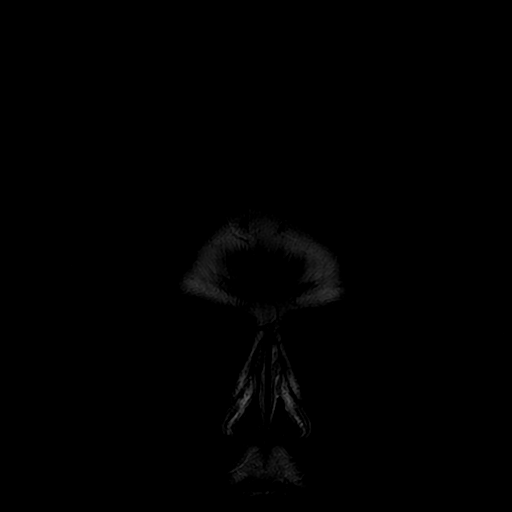

[Series 14: T1 post-contrast · coronal · 5.0mm · 0.39mm/px · 3 of 28 slices shown]
[im 1/28]
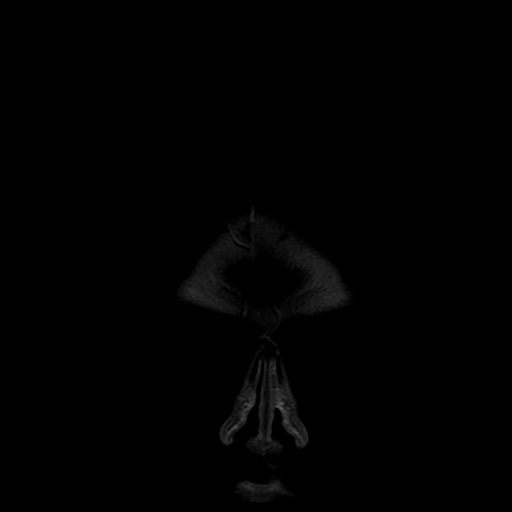
[im 14/28]
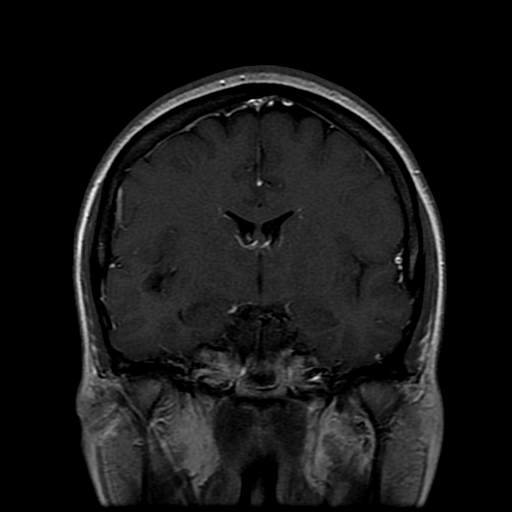
[im 28/28]
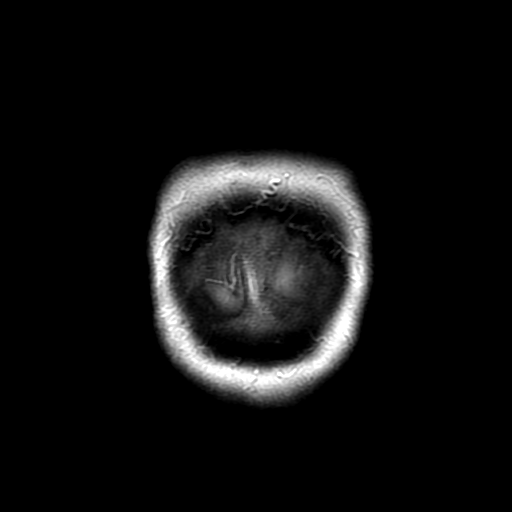

[Series 400: DWI · axial · 5.0mm · 1.09mm/px · z∈[-60,+84]mm · 3 of 30 slices shown (2 of 2)]
[im 1/30]
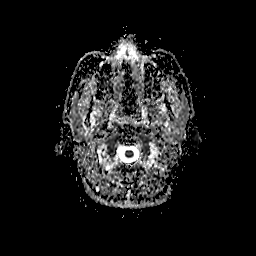
[im 15/30]
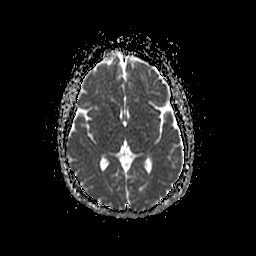
[im 30/30]
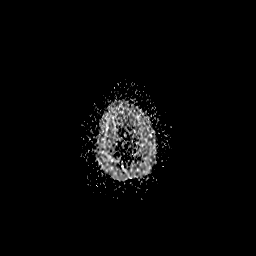

[25 of 48 positions shown; findings below may reference images not displayed]

FINDINGS: Brain: The brain has a normal appearance without evidence of
malformation, atrophy, old or acute small or large vessel
infarction, mass lesion, hemorrhage, hydrocephalus or extra-axial
collection. Mesial temporal lobes are symmetric and normal. After
contrast administration, no abnormal enhancement occurs.

Vascular: Major vessels at the base of the brain show flow. Venous
sinuses appear patent.

Skull and upper cervical spine: Normal.

Sinuses/Orbits: Mild mucosal inflammatory changes of the paranasal
sinuses. Orbits negative.

Other: None significant.
IMPRESSION: Normal examination.  No cause or sequela of seizure is identified.

## 2019-05-08 DIAGNOSIS — Z79899 Other long term (current) drug therapy: Secondary | ICD-10-CM | POA: Diagnosis not present

## 2019-05-10 ENCOUNTER — Telehealth: Payer: Self-pay | Admitting: *Deleted

## 2019-05-10 DIAGNOSIS — M329 Systemic lupus erythematosus, unspecified: Secondary | ICD-10-CM

## 2019-05-10 NOTE — Telephone Encounter (Signed)
Copied from [place] [phone]. Topic: Quick Communication - See Telephone Encounter >> May 10, 2019  2:39 PM Loma [place] wrote: CRM for notification. See Telephone encounter for: 05/10/19.pt has called in wanted to see if she could have the antibodies test done for COVID since she was very sick after a plane trip in [place]. She has lupus and would really like to know if she has had COVID please FU with pt at [phone]

## 2019-05-11 NOTE — Telephone Encounter (Signed)
Patient informed labs were placed  

## 2019-05-11 NOTE — Telephone Encounter (Signed)
Order placed can come to lab anytime.

## 2019-05-31 DIAGNOSIS — M329 Systemic lupus erythematosus, unspecified: Secondary | ICD-10-CM | POA: Diagnosis not present

## 2019-06-07 DIAGNOSIS — Z1159 Encounter for screening for other viral diseases: Secondary | ICD-10-CM | POA: Diagnosis not present

## 2019-06-07 DIAGNOSIS — Z79899 Other long term (current) drug therapy: Secondary | ICD-10-CM | POA: Diagnosis not present

## 2019-06-23 ENCOUNTER — Other Ambulatory Visit: Payer: Self-pay | Admitting: Internal Medicine

## 2019-06-23 ENCOUNTER — Telehealth: Payer: Self-pay | Admitting: Internal Medicine

## 2019-06-23 NOTE — Telephone Encounter (Signed)
Copied from [place] [phone]. Topic: Quick Communication - Rx Refill/Question >> Jun 23, 2019 12:51 PM Mcneil, Ja-Kwan wrote: Medication: traMADol (ULTRAM) 50 MG tablet and cyclobenzaprine (FLEXERIL) 10 MG tablet  Has the patient contacted their pharmacy? yes   Preferred Pharmacy (with phone number or street name): [id number] - [place], [address] AT [phone] (Phone)  [phone] (Fax)  Agent: Please be advised that RX refills may take up to 3 business days. We ask that you follow-up with your pharmacy.

## 2019-06-23 NOTE — Telephone Encounter (Signed)
I don't see either of these on current med list

## 2019-06-23 NOTE — Telephone Encounter (Signed)
Cannot have tramadol refill as has been getting opioids from other providers since last visit. Flexeril 1 year supply sent in May so should not need refill.

## 2019-07-01 ENCOUNTER — Telehealth: Payer: Self-pay | Admitting: Internal Medicine

## 2019-07-03 DIAGNOSIS — Z79899 Other long term (current) drug therapy: Secondary | ICD-10-CM | POA: Diagnosis not present

## 2019-07-03 DIAGNOSIS — D8989 Other specified disorders involving the immune mechanism, not elsewhere classified: Secondary | ICD-10-CM | POA: Diagnosis not present

## 2019-07-03 DIAGNOSIS — M329 Systemic lupus erythematosus, unspecified: Secondary | ICD-10-CM | POA: Diagnosis not present

## 2019-07-03 NOTE — Telephone Encounter (Signed)
Patient notified of message in [place] and states dr crawford was aware of her other medications, she made appt to discuss meds with PCP

## 2019-07-03 NOTE — Telephone Encounter (Signed)
Noted  

## 2019-07-03 NOTE — Telephone Encounter (Signed)
Pt called and is requesting to know why this medication was denied. Please advise.  °

## 2019-07-07 DIAGNOSIS — Z79899 Other long term (current) drug therapy: Secondary | ICD-10-CM | POA: Diagnosis not present

## 2019-07-12 ENCOUNTER — Ambulatory Visit: Payer: BC Managed Care – PPO | Admitting: Internal Medicine

## 2019-07-13 ENCOUNTER — Ambulatory Visit: Payer: BC Managed Care – PPO | Admitting: Internal Medicine

## 2019-07-18 ENCOUNTER — Ambulatory Visit: Payer: BC Managed Care – PPO | Admitting: Internal Medicine

## 2019-08-15 ENCOUNTER — Ambulatory Visit: Payer: BC Managed Care – PPO | Admitting: Internal Medicine

## 2019-08-22 ENCOUNTER — Other Ambulatory Visit: Payer: Self-pay

## 2019-08-22 ENCOUNTER — Ambulatory Visit (INDEPENDENT_AMBULATORY_CARE_PROVIDER_SITE_OTHER): Payer: BC Managed Care – PPO | Admitting: Internal Medicine

## 2019-08-22 ENCOUNTER — Encounter: Payer: Self-pay | Admitting: Internal Medicine

## 2019-08-22 DIAGNOSIS — Z981 Arthrodesis status: Secondary | ICD-10-CM

## 2019-08-22 MED ORDER — CYCLOBENZAPRINE HCL 10 MG PO TABS: 10.0000 mg | ORAL_TABLET | Freq: Three times a day (TID) | ORAL | 11 refills | Status: DC | PRN

## 2019-08-22 NOTE — Assessment & Plan Note (Signed)
Working through pain issues and encouragement given today. Refill flexeril.

## 2019-08-22 NOTE — Progress Notes (Signed)
   Subjective:   Patient ID: Kathleen Ashley, female    DOB: 1980-09-09, 38 y.o.   MRN: [id number]  HPI The patient is a 38 YO female coming in for concerns about overall health and well being. She is currently going through therapy with some opioid usage. She is working through this and getting support from mother and husband. She does have chronic back pain and this is better some days and worse others. She is getting on a new lupus medication soon and this will hopefully help with how she feels. Did try to get off suboxone and had a withdrawal seizure.   Review of Systems  Constitutional: Positive for fatigue.  HENT: Negative.   Eyes: Negative.   Respiratory: Negative for cough, chest tightness and shortness of breath.   Cardiovascular: Negative for chest pain, palpitations and leg swelling.  Gastrointestinal: Negative for abdominal distention, abdominal pain, constipation, diarrhea, nausea and vomiting.  Musculoskeletal: Positive for arthralgias and myalgias.  Skin: Negative.   Neurological: Negative.   Psychiatric/Behavioral: Negative.     Objective:  Physical Exam Constitutional:      Appearance: She is well-developed.  HENT:     Head: Normocephalic and atraumatic.  Cardiovascular:     Rate and Rhythm: Normal rate and regular rhythm.  Pulmonary:     Effort: Pulmonary effort is normal. No respiratory distress.     Breath sounds: Normal breath sounds. No wheezing or rales.  Abdominal:     General: Bowel sounds are normal. There is no distension.     Palpations: Abdomen is soft.     Tenderness: There is no abdominal tenderness. There is no rebound.  Musculoskeletal:     Cervical back: Normal range of motion.  Skin:    General: Skin is warm and dry.  Neurological:     Mental Status: She is alert and oriented to person, place, and time.     Coordination: Coordination normal.     Vitals:   08/22/19 1258  BP: (!) 142/96  Pulse: (!) 120  Temp: 98.8 F (37.1 C)  TempSrc:  Oral  SpO2: 98%  Weight: 182 lb (82.6 kg)  Height: 5\' 6"  (1.676 m)    This visit occurred during the SARS-CoV-2 public health emergency.  Safety protocols were in place, including screening questions prior to the visit, additional usage of staff PPE, and extensive cleaning of exam room while observing appropriate contact time as indicated for disinfecting solutions.   Assessment & Plan:  Visit time 25 minutes: greater than 50% of that time was spent in face to face counseling and coordination of care with the patient: counseled about as above

## 2020-03-14 ENCOUNTER — Other Ambulatory Visit: Payer: Self-pay

## 2020-04-05 ENCOUNTER — Ambulatory Visit: Payer: BC Managed Care – PPO

## 2020-04-09 ENCOUNTER — Other Ambulatory Visit: Payer: Self-pay | Admitting: Internal Medicine

## 2020-04-12 ENCOUNTER — Other Ambulatory Visit: Payer: Self-pay

## 2020-04-12 ENCOUNTER — Ambulatory Visit: Payer: BC Managed Care – PPO | Attending: Obstetrics and Gynecology

## 2020-04-12 ENCOUNTER — Ambulatory Visit (HOSPITAL_BASED_OUTPATIENT_CLINIC_OR_DEPARTMENT_OTHER): Payer: BC Managed Care – PPO | Admitting: *Deleted

## 2020-04-12 DIAGNOSIS — O09529 Supervision of elderly multigravida, unspecified trimester: Secondary | ICD-10-CM

## 2020-04-12 DIAGNOSIS — Z3169 Encounter for other general counseling and advice on procreation: Secondary | ICD-10-CM

## 2020-04-12 DIAGNOSIS — M329 Systemic lupus erythematosus, unspecified: Secondary | ICD-10-CM | POA: Diagnosis not present

## 2020-04-12 DIAGNOSIS — Z981 Arthrodesis status: Secondary | ICD-10-CM

## 2020-04-12 DIAGNOSIS — M328 Other forms of systemic lupus erythematosus: Secondary | ICD-10-CM

## 2020-04-12 DIAGNOSIS — O99119 Other diseases of the blood and blood-forming organs and certain disorders involving the immune mechanism complicating pregnancy, unspecified trimester: Secondary | ICD-10-CM | POA: Diagnosis not present

## 2020-04-12 DIAGNOSIS — M3219 Other organ or system involvement in systemic lupus erythematosus: Secondary | ICD-10-CM

## 2020-04-12 DIAGNOSIS — Z9884 Bariatric surgery status: Secondary | ICD-10-CM

## 2020-04-12 NOTE — Progress Notes (Signed)
Pt here today for preconception consult in [place]. LMP 04-03-20. BP 117/78, HR 91.

## 2020-04-19 NOTE — Consult Note (Signed)
MFM Consult  Patient Name: Kathleen Ashley Date of Birth: 1980-12-06  Kathleen Ashley is a 39 year old gravida 1 para 0-0-1-0 who was seen for preconception consultation due to a history of multiple prior surgeries and lupus.  She reports that she was diagnosed with lupus in 2012-2013.  Her primary symptoms related to lupus are joint pain, fatigue, and hair loss.  She has screened positive for antinuclear (ANA) antibodies.  She has also experienced pleurisy due to lupus.  She denies any chronic kidney issues related to lupus.  She reports that her last lupus flare occurred a few weeks ago after she received the first dose of the COVID-19 vaccine.  She is completing a course of prednisone to treat the lupus flare.    She also reports that she has screened positive for the anticardiolipin antibody at least two times over multiple years.  She denies any history of a thromboembolic event. She is being followed by her rheumatologist Dr. Ephriam Jenkins.  She also reports that she possibly could have screened positive as having the anti-Ro (SSA) antibodies.  I could not find any documentation of this.  Kathleen Ashley also has an extensive surgical history including:  Gastric bypass surgery in 2008  A laminectomy for bulging disks in her spine in 2014 followed by a spinal fusion in 2018  Laparoscopic myomectomy to remove a 9 cm fibroid in her uterus  Breast implant surgery  D&C for termination of pregnancy  Other than lupus, she denies any other significant past medical history.  Her past OB history includes an elective termination of pregnancy many years ago.  Her current medications include:  Buprenorphine 8 mg twice a week.  She reports that she will try to wean from the pain medications prior to conceiving a pregnancy.    Plaquenil 200 mg daily.  She will discuss with her rheumatologist if she should increase the dosage of Plaquenil to 400 mg daily  Daily baby aspirin  Prednisone 5 mg.  She is  discontinuing the prednisone today.   A daily multivitamin.  During our consultation today, the following issues were discussed:  Lupus in pregnancy The implications and management of lupus in pregnancy was discussed in detail with the patient today.  She was reassured that most women with lupus can anticipate a successful pregnancy outcome, especially those who have fairly inactive disease and normal kidney function. Women with lupus may be at increased risk for having additional flares during pregnancy. However, the risk of additional flares is lower should she conceive during a period of inactive disease.   She was advised that an acute lupus flare may be treated with oral steroids such as prednisone.  Prednisone may be taken safely during pregnancy if necessary.  Plaquenil may also be taken safely during pregnancy for prevention of an acute lupus flare.  The increased risk of fetal growth restriction, an indicated preterm delivery, and early onset preeclampsia associated with lupus in pregnancy was discussed.    The diagnosis of an acute lupus flare versus preeclampsia may be difficult during pregnancy as they share many similar symptoms and laboratory values (thrombocytopenia, proteinuria). Increasing levels of anti-double stranded DNA antibodies may be helpful to differentiate an acute lupus flare versus preeclampsia.   She should continue to be followed by her rheumatologist during her future pregnancy. Due to lupus in pregnancy, she should continue to be followed with serial growth ultrasounds every 4 to 5 weeks during her future pregnancy.  Weekly fetal testing should be started at  around 32 weeks.  She should continue taking the medications for treatment of lupus as recommended by her rheumatologist.  She should have a baseline 24-hour urine to assess for total protein.  As it appears that her kidney functions are within normal limits as evidenced by the normal serum creatinine levels in her  records, I anticipate that she should have a good pregnancy outcome.  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  She was advised that the anti-Ro antibodies may cross the placenta and may cause a disruption in the conduction system of the fetal heart resulting in persistent bradycardia.  She was advised that the overall risk of a fetal congenital heart block in women with the anti-Ro antibodies is low (1%-2% risk).    There are limited treatments of a fetal congenital heart block should it develop in utero.  Due to the lack of treatments, we are no longer following women who are positive for the anti-Ro antibodies with weekly PR intervals. There may be some anecdotal evidence that indicates that Plaquenil may be protective of a fetal congenital heart block heart block in women with the anti-Ro antibodies.  Therefore, she should continue taking Plaquenil throughout her future pregnancy.  She understands that should her baby develop a congenital heart block, that a pacemaker may need to be implanted after delivery.  She should have a fetal echocardiogram scheduled at between 20 to 23 weeks should she be positive for the anti-Ro antibodies.  Positive anticardiolipin antibody As Kathleen Ashley has never had a thromboembolic event and does not have any history of an adverse pregnancy outcome (recurrent miscarriages, prior IUFD, or early onset severe preeclampsia) she does not meet the criteria for the diagnosis of the antiphospholipid antibody syndrome.  Although controversial, I would consider placing her on prophylactic Lovenox throughout her future pregnancy in addition to the daily baby aspirin, as we would not want to take the risk of her having an adverse event or outcome before starting her on prophylactic Lovenox.  Should she be placed on prophylactic Lovenox in her future pregnancy,  the medication may be continued up until the day before her scheduled delivery.  As she will be treated with a prophylactic dose, she would need to be off of the medication for 12 hours before being eligible for regional anesthesia.  Prior myomectomy Due to her prior myomectomy, a cesarean delivery should be scheduled at around 37 weeks.  Prior gastric bypass surgery Most women with a history of gastric bypass surgery generally have an uneventful pregnancy.  There have been reports of fetal growth restriction and possible nutritional deficiencies in women who have had a prior gastric bypass surgery.  She should be followed with serial growth ultrasounds to assess for fetal growth restriction.  She should probably eat more frequent but smaller meals during her future pregnancy.  As women with a prior gastric bypass surgery may not tolerate the glucose load associated with a glucose challenge test, consideration may be given to performing daily fingersticks 4 times a day (fasting and 2 hours after each meal) starting at around 28 weeks to screen for gestational diabetes.  Prior laminectomy with spinal fusion surgery The patient reports that her laminectomy occurred at the L5 level.  She should have an anesthesia consult prior to delivery to determine if she would be eligible for regional anesthesia for her scheduled cesarean delivery.  The patient  understands that she may have to undergo general anesthesia should she be ineligible to receive regional anesthesia.  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  She was reassured that should she be unable to wean off of buprenorphine, that women who are treated with buprenorphine during pregnancy generally have good outcomes.  The increased risk that her baby may have withdrawal symptoms due to treatment with buprenorphine during pregnancy was discussed.  She was advised that most  babies born to mothers who were taking buprenorphine during pregnancy generally develop only mild withdrawal symptoms which is usually treated with skin-to-skin care.  A minority of the infants may require prolonged hospitalization to be weaned off of opioids.  Advanced maternal age in pregnancy The increased risk of adverse pregnancy outcomes and fetal aneuploidy in women of advanced maternal age was discussed.  She should have a cell free DNA test drawn at 10 weeks or greater to screen for fetal aneuploidy.  The patient was encouraged to receive the second dose of the COVID-19 vaccine prior to conceiving her future pregnancy.  She was advised that the CDC and other national organizations have recommend the Covid vaccine for all women who are considering conceiving a pregnancy in the future.  Recommendations for her future pregnancy: Continue close follow-up with her rheumatologist  Take all medications recommended by her rheumatologist for the management of lupus during her future pregnancy  Verify that she is positive for the anti-Ro (SSA) antibodies and the anticardiolipin antibodies  Collect a baseline 24-hour urine to assess for total protein  Have an early ultrasound to verify gestational age  Have a cell free DNA test to screen for fetal aneuploidy  Detailed fetal anatomy scan at 19 weeks followed by serial growth ultrasounds every 4 to 5 weeks  Weekly fetal testing starting at 32 weeks  Consider starting prophylactic Lovenox in addition to a daily baby aspirin should she be positive for the anticardiolipin antibodies  Anesthesia consult prior to delivery  Cesarean delivery at around 37 weeks  At the end of the consultation, the patient stated that all her questions were answered to her complete satisfaction.  We look forward to managing her with you during her future pregnancy.  Total time spent 60 minutes.   This note was copied from an original document generated using  [employer].

## 2020-05-27 ENCOUNTER — Other Ambulatory Visit: Payer: Self-pay

## 2020-08-19 ENCOUNTER — Other Ambulatory Visit: Payer: Self-pay | Admitting: Internal Medicine

## 2020-10-25 ENCOUNTER — Other Ambulatory Visit: Payer: Self-pay

## 2020-10-25 ENCOUNTER — Emergency Department (HOSPITAL_COMMUNITY)
Admission: EM | Admit: 2020-10-25 | Discharge: 2020-10-29 | Disposition: A | Discharge status: Home / Self Care | Payer: BC Managed Care – PPO | Attending: Emergency Medicine | Admitting: Emergency Medicine

## 2020-10-25 ENCOUNTER — Encounter (HOSPITAL_COMMUNITY): Payer: Self-pay | Admitting: Emergency Medicine

## 2020-10-25 DIAGNOSIS — F333 Major depressive disorder, recurrent, severe with psychotic symptoms: Secondary | ICD-10-CM

## 2020-10-25 DIAGNOSIS — Z20822 Contact with and (suspected) exposure to covid-19: Secondary | ICD-10-CM | POA: Insufficient documentation

## 2020-10-25 DIAGNOSIS — F29 Unspecified psychosis not due to a substance or known physiological condition: Secondary | ICD-10-CM | POA: Insufficient documentation

## 2020-10-25 DIAGNOSIS — F419 Anxiety disorder, unspecified: Secondary | ICD-10-CM | POA: Diagnosis present

## 2020-10-25 DIAGNOSIS — F329 Major depressive disorder, single episode, unspecified: Secondary | ICD-10-CM | POA: Insufficient documentation

## 2020-10-25 DIAGNOSIS — Z046 Encounter for general psychiatric examination, requested by authority: Secondary | ICD-10-CM | POA: Diagnosis not present

## 2020-10-25 DIAGNOSIS — R45851 Suicidal ideations: Secondary | ICD-10-CM | POA: Insufficient documentation

## 2020-10-25 DIAGNOSIS — Z9189 Other specified personal risk factors, not elsewhere classified: Secondary | ICD-10-CM

## 2020-10-25 LAB — COMPREHENSIVE METABOLIC PANEL
ALT: 22 U/L (ref 0–44)
AST: 26 U/L (ref 15–41)
Albumin: 4 g/dL (ref 3.5–5.0)
Alkaline Phosphatase: 68 U/L (ref 38–126)
Anion gap: 10 (ref 5–15)
BUN: 5 mg/dL — ABNORMAL LOW (ref 6–20)
CO2: 23 mmol/L (ref 22–32)
Calcium: 9.4 mg/dL (ref 8.9–10.3)
Chloride: 103 mmol/L (ref 98–111)
Creatinine, Ser: 0.63 mg/dL (ref 0.44–1.00)
GFR, Estimated: >60 mL/min (ref >60)
Glucose, Bld: 173 mg/dL — ABNORMAL HIGH (ref 70–99)
Potassium: 3.4 mmol/L — ABNORMAL LOW (ref 3.5–5.1)
Sodium: 136 mmol/L (ref 135–145)
Total Bilirubin: 0.6 mg/dL (ref 0.3–1.2)
Total Protein: 7.7 g/dL (ref 6.5–8.1)

## 2020-10-25 LAB — CBC
HCT: 44.5 % (ref 36.0–46.0)
Hemoglobin: 14.7 g/dL (ref 12.0–15.0)
MCH: 28.9 pg (ref 26.0–34.0)
MCHC: 33 g/dL (ref 30.0–36.0)
MCV: 87.4 fL (ref 80.0–100.0)
Platelets: 395 10*3/uL (ref 150–400)
RBC: 5.09 MIL/uL (ref 3.87–5.11)
RDW: 13.4 % (ref 11.5–15.5)
WBC: 9 10*3/uL (ref 4.0–10.5)
nRBC: 0 % (ref 0.0–0.2)

## 2020-10-25 LAB — SALICYLATE LEVEL: Salicylate Lvl: <7 mg/dL — ABNORMAL LOW (ref 7.0–30.0)

## 2020-10-25 LAB — ETHANOL: Alcohol, Ethyl (B): <10 mg/dL (ref <10)

## 2020-10-25 LAB — RAPID URINE DRUG SCREEN, HOSP PERFORMED
Amphetamines: NOT DETECTED
Barbiturates: NOT DETECTED
Benzodiazepines: NOT DETECTED
Cocaine: NOT DETECTED
Opiates: NOT DETECTED
Tetrahydrocannabinol: NOT DETECTED

## 2020-10-25 LAB — I-STAT BETA HCG BLOOD, ED (MC, WL, AP ONLY): I-stat hCG, quantitative: <5 m[IU]/mL (ref <5)

## 2020-10-25 LAB — ACETAMINOPHEN LEVEL: Acetaminophen (Tylenol), Serum: <10 ug/mL — ABNORMAL LOW (ref 10–30)

## 2020-10-25 MED ORDER — OLANZAPINE 5 MG PO TABS
5.0000 mg | ORAL_TABLET | Freq: Every day | ORAL | Status: DC
  Administered 2020-10-27: 5 mg via ORAL
  Filled 2020-10-25 (×3): qty 1

## 2020-10-25 MED ORDER — NICOTINE 14 MG/24HR TD PT24
14.0000 mg | MEDICATED_PATCH | Freq: Every day | TRANSDERMAL | Status: DC
  Administered 2020-10-25 – 2020-10-28 (×2): 14 mg via TRANSDERMAL
  Filled 2020-10-25 (×4): qty 1

## 2020-10-25 MED ORDER — LORAZEPAM 1 MG PO TABS
1.0000 mg | ORAL_TABLET | Freq: Once | ORAL | Status: AC
  Administered 2020-10-25: 1 mg via ORAL
  Filled 2020-10-25: qty 1

## 2020-10-25 MED ORDER — PREDNISONE 20 MG PO TABS
20.0000 mg | ORAL_TABLET | Freq: Once | ORAL | Status: AC
  Administered 2020-10-25: 20 mg via ORAL
  Filled 2020-10-25: qty 1

## 2020-10-25 MED ORDER — HYDROXYZINE HCL 25 MG PO TABS
25.0000 mg | ORAL_TABLET | Freq: Once | ORAL | Status: AC
  Administered 2020-10-25: 25 mg via ORAL
  Filled 2020-10-25: qty 1

## 2020-10-25 MED ORDER — ZIPRASIDONE MESYLATE 20 MG IM SOLR
INTRAMUSCULAR | Status: AC
  Administered 2020-10-25: 20 mg
  Filled 2020-10-25: qty 20

## 2020-10-25 MED ORDER — STERILE WATER FOR INJECTION IJ SOLN
INTRAMUSCULAR | Status: AC
  Administered 2020-10-25: 10 mL
  Filled 2020-10-25: qty 10

## 2020-10-25 MED ORDER — ACETAMINOPHEN 500 MG PO TABS
1000.0000 mg | ORAL_TABLET | Freq: Once | ORAL | Status: AC
  Administered 2020-10-25: 1000 mg via ORAL
  Filled 2020-10-25: qty 2

## 2020-10-25 MED ORDER — KETOROLAC TROMETHAMINE 60 MG/2ML IM SOLN
30.0000 mg | Freq: Once | INTRAMUSCULAR | Status: AC
  Administered 2020-10-25: 30 mg via INTRAMUSCULAR
  Filled 2020-10-25: qty 2

## 2020-10-25 NOTE — ED Notes (Signed)
Pt woke up while the housekeeper was cleaning the pt's room. Pt became super anxious and wanted to know what the housekeeper did. Pt stated "I was sleeping and was fine, now I don't feel good something is wrong and it's her fault." This RN reassured pt that the housekeeper did nothing to her, that the housekeeper was just cleaning her room. Pt yelling to express herself but not violent in any way. This RN was able to talk with pt and calm pt down. Will continue to monitor.

## 2020-10-25 NOTE — Progress Notes (Signed)
Visited with patient per request to provide support.  Visited with door opened.  Patient sitting up on bed .Pt was calm and wanted to know if her husband was aware she was here and if he brought her.  I confirmed with pt. Nurse that yes her husband brought her here and after knowing that she was better.  Patient said she wanted to kill somebody but not herself. Then said thank you Chaplain for coming. This was said in a tone that suggested that the visit was long enough. During the entire visit patient was calm and respectful.  Provided ministry of presence and emotional support.  Will follow as needed.  Jaclynn Major, [place], [facility], Pager [phone]
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
## 2020-10-25 NOTE — BH Assessment (Addendum)
Per Leevy-Johnson patient is recommended for a inpatient admission.   @ [place] notified of patient's bed needs.   @1907  AC ([place]) states that [facility] does not have any appropriate beds available.   Patient to be reviewed tomorrow for consideration of a bed at [facility]; pending [facility] discharges.

## 2020-10-25 NOTE — ED Notes (Signed)
  Patient requested to use the phone at the charge desk.  Patient attempted to call mother but got the voicemail.  Mother called back shortly after to talk with patient.  While speaking with the mother, the patients father got on the phone and patient became upset and hung up phone.  Patient states she was ok and just did not want to speak with her father.
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
## 2020-10-25 NOTE — ED Notes (Signed)
Pt refused temp 

## 2020-10-25 NOTE — ED Notes (Signed)
Patient arrived to the purple zone via stetcher and walked to her hospital bed. Immediately laid down and pulled blanket over her head. Husband called for update and he was oriented to visitation and call restrictions. He verbalized understanding. Patient oriented to this treatment area (phone policy and location of shower and restroom.) Phone number of husband placed on post it on board in room. Patient verbalized understanding that she can request that someone not come in her room if not needed to provide medical care, and that she can use her words to ask someone to leave. Patient tearful, verbalized understanding. Door closed and sitter outside with line of sight through window. No distress noted.

## 2020-10-25 NOTE — ED Notes (Signed)
  Patient requested nicotine patch.  Order given by Dr. Rogene [place]
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
## 2020-10-25 NOTE — ED Triage Notes (Signed)
Patient requesting psychiatric evaluation reports suicidal ideation but did not disclose her plan at triage , A/V hallucinations and anxiety .

## 2020-10-25 NOTE — BH Assessment (Addendum)
Per [facility] patient is recommended for a inpatient admission. @ [place] notified of patient's bed needs.  Patient is now under review for consideration of bed placement at [facility] (adult unit).

## 2020-10-26 LAB — SARS CORONAVIRUS 2 (TAT 6-24 HRS): SARS Coronavirus 2: NEGATIVE

## 2020-10-26 MED ORDER — ACETAMINOPHEN 325 MG PO TABS
650.0000 mg | ORAL_TABLET | Freq: Four times a day (QID) | ORAL | Status: DC | PRN
  Administered 2020-10-26: 650 mg via ORAL
  Filled 2020-10-26 (×3): qty 2

## 2020-10-26 MED ORDER — CYCLOBENZAPRINE HCL 10 MG PO TABS
10.0000 mg | ORAL_TABLET | Freq: Two times a day (BID) | ORAL | Status: DC | PRN
  Administered 2020-10-27 – 2020-10-28 (×3): 10 mg via ORAL
  Filled 2020-10-26 (×4): qty 1

## 2020-10-26 MED ORDER — PREDNISONE 20 MG PO TABS
10.0000 mg | ORAL_TABLET | Freq: Once | ORAL | Status: AC
  Administered 2020-10-26: 10 mg via ORAL
  Filled 2020-10-26: qty 1

## 2020-10-26 MED ORDER — ZIPRASIDONE MESYLATE 20 MG IM SOLR
10.0000 mg | Freq: Once | INTRAMUSCULAR | Status: AC
  Administered 2020-10-26: 10 mg via INTRAMUSCULAR

## 2020-10-26 MED ORDER — TRAMADOL HCL 50 MG PO TABS
50.0000 mg | ORAL_TABLET | Freq: Every day | ORAL | Status: DC | PRN
  Administered 2020-10-26 – 2020-10-28 (×2): 50 mg via ORAL
  Filled 2020-10-26 (×2): qty 1

## 2020-10-26 MED ORDER — MELATONIN 5 MG PO TABS
5.0000 mg | ORAL_TABLET | Freq: Once | ORAL | Status: AC
  Administered 2020-10-26: 5 mg via ORAL
  Filled 2020-10-26: qty 1

## 2020-10-26 MED ORDER — STERILE WATER FOR INJECTION IJ SOLN: 0.6000 mL | Freq: Once | INTRAMUSCULAR | Status: AC

## 2020-10-26 MED ORDER — IBUPROFEN 400 MG PO TABS
600.0000 mg | ORAL_TABLET | Freq: Two times a day (BID) | ORAL | Status: DC | PRN
  Administered 2020-10-27 – 2020-10-29 (×3): 600 mg via ORAL
  Filled 2020-10-26 (×4): qty 1

## 2020-10-26 MED ORDER — STERILE WATER FOR INJECTION IJ SOLN
INTRAMUSCULAR | Status: AC
  Administered 2020-10-26: 0.6 mL via INTRAMUSCULAR
  Filled 2020-10-26: qty 10

## 2020-10-26 MED ORDER — ZIPRASIDONE MESYLATE 20 MG IM SOLR: 20.0000 mg | Freq: Once | INTRAMUSCULAR | Status: DC

## 2020-10-26 MED ORDER — LORAZEPAM 1 MG PO TABS
2.0000 mg | ORAL_TABLET | ORAL | Status: AC
  Administered 2020-10-26: 2 mg via ORAL
  Filled 2020-10-26: qty 2

## 2020-10-26 MED ORDER — BUPRENORPHINE HCL-NALOXONE HCL 2-0.5 MG SL SUBL
1.0000 | SUBLINGUAL_TABLET | Freq: Every day | SUBLINGUAL | Status: DC
  Administered 2020-10-26 – 2020-10-29 (×4): 1 via SUBLINGUAL
  Filled 2020-10-26 (×4): qty 1

## 2020-10-26 MED ORDER — LORAZEPAM 1 MG PO TABS
1.0000 mg | ORAL_TABLET | Freq: Four times a day (QID) | ORAL | Status: DC | PRN
  Administered 2020-10-27 – 2020-10-28 (×4): 1 mg via ORAL
  Filled 2020-10-26 (×6): qty 1

## 2020-10-26 MED ORDER — ZIPRASIDONE MESYLATE 20 MG IM SOLR: 10.0000 mg | Freq: Once | INTRAMUSCULAR | Status: AC

## 2020-10-26 NOTE — ED Notes (Signed)
  Patient given toothbrush and toothpaste per request to brush her teeth.

## 2020-10-26 NOTE — ED Notes (Signed)
Pt has been to the desk multiple times already asking for medications that are not even ordered or on her list of medications that she takes. Will mention to md. Pt is asking for Minipress 25mg . States that she takes it for Lupus.

## 2020-10-26 NOTE — ED Notes (Signed)
Pt up and taking shower, pt calm and cooperative. Sitter outside of shower door.

## 2020-10-26 NOTE — ED Notes (Signed)
Pt up in bed coloring. Speaking with [employer] officer.

## 2020-10-26 NOTE — ED Notes (Signed)
Pt pacing quickly pacing back and forth.  Becoming agitated and accusing RN of not giving her ativan when she asked for it.  She has not asked for ativan.

## 2020-10-26 NOTE — Progress Notes (Signed)
Per Oneida Alar NP, patient meets criteria for inpatient treatment  There are no available beds at [facility] today. CSW faxed referrals to the following facilities for review:  [facility]  TTS will continue to seek bed placement.   Maxie Better, MSW, LCSW Clinical Social Worker 10/26/2020 12:06 PM

## 2020-10-26 NOTE — ED Notes (Signed)
Pt requesting meds for HA

## 2020-10-26 NOTE — ED Notes (Signed)
Requested home meds from MD.
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
## 2020-10-26 NOTE — ED Notes (Signed)
Pt returns to room with sitter at bedside.
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
## 2020-10-26 NOTE — ED Notes (Signed)
Pt requesting ativan so that she can sleep.  PA notified.

## 2020-10-26 NOTE — ED Notes (Signed)
PT appears pleasant and calm.  Requesting tylenol for headache.

## 2020-10-26 NOTE — ED Notes (Signed)
Pt received breakfast tray 

## 2020-10-26 NOTE — ED Notes (Signed)
  Patient sleeping at this time.  Waking up every hour or so with loud shrills.

## 2020-10-27 MED ORDER — PREDNISONE 20 MG PO TABS
20.0000 mg | ORAL_TABLET | Freq: Once | ORAL | Status: AC
  Administered 2020-10-27: 20 mg via ORAL
  Filled 2020-10-27: qty 1

## 2020-10-27 NOTE — ED Notes (Signed)
Pt using her 1st phone call.
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
## 2020-10-27 NOTE — ED Notes (Signed)
Vira Browns, to assist with getting pt med from main pharm.

## 2020-10-27 NOTE — ED Notes (Addendum)
Updated pt husband. He would like her therapist to be able to speak with her or staff.  Kathleen Ashley [phone]  [email or web address]  Pt prefers female caregivers.

## 2020-10-27 NOTE — ED Notes (Signed)
Bathroom spray found in pts room while pt was in restroom. Removed from room.

## 2020-10-27 NOTE — ED Notes (Signed)
Pt is having TTS °

## 2020-10-27 NOTE — ED Notes (Signed)
Lunch Tray Ordered @ 1004. 

## 2020-10-27 NOTE — ED Notes (Signed)
Pt is asking for something else to help her sleep. States that she is having nightmares.
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
## 2020-10-27 NOTE — ED Notes (Signed)
Pt used her 2nd call for today.

## 2020-10-27 NOTE — ED Notes (Signed)
Pt asking for her phone to get her mothers phone number out of it so she can call her. Told her that her phone is locked up and this RN cannot get to it. Previous RN stated that she has had her 2 phone calls today. Pt has been out to the desk 3 times already asking me this.

## 2020-10-27 NOTE — ED Notes (Signed)
Dinner Tray Ordered @ 1654. 

## 2020-10-27 NOTE — ED Notes (Signed)
Pt out of room and out to desk to ask questions. Pt also comes behind nurses station frequently. Reminded not to do this. Pt grabbing things off of desk and off of desk on the back wall. Told not to come behind the desk at all.

## 2020-10-27 NOTE — ED Notes (Signed)
Pt made a phone call 

## 2020-10-27 NOTE — ED Notes (Signed)
Pt sleeping soundly. Resp even and non-labored. Will continue to monitor.  

## 2020-10-27 NOTE — ED Notes (Signed)
Patient's husband provided update.

## 2020-10-27 NOTE — Progress Notes (Addendum)
Per Oneida Alar NP, patient continues to meets criteria for inpatient treatment. CSW followed up with [facility], [facility] who indicated Pt. is under review for [facility] pending discharges. CSW re-faxed referrals to the following facilities for review:  [facility]  TTS will continue to seek bed placement.  Glennie Isle, MSW, [place], LCAS-A Phone: [phone] Disposition/TOC

## 2020-10-28 DIAGNOSIS — F333 Major depressive disorder, recurrent, severe with psychotic symptoms: Secondary | ICD-10-CM

## 2020-10-28 MED ORDER — TRAZODONE HCL 50 MG PO TABS
50.0000 mg | ORAL_TABLET | Freq: Every evening | ORAL | Status: DC | PRN
  Administered 2020-10-28: 50 mg via ORAL
  Filled 2020-10-28: qty 1

## 2020-10-28 MED ORDER — PRAZOSIN HCL 1 MG PO CAPS
1.0000 mg | ORAL_CAPSULE | Freq: Every day | ORAL | Status: DC
  Administered 2020-10-28: 1 mg via ORAL
  Filled 2020-10-28 (×2): qty 1

## 2020-10-28 MED ORDER — PREDNISONE 20 MG PO TABS
20.0000 mg | ORAL_TABLET | Freq: Once | ORAL | Status: AC
  Administered 2020-10-28: 20 mg via ORAL
  Filled 2020-10-28: qty 1

## 2020-10-28 MED ORDER — OLANZAPINE 5 MG PO TABS
10.0000 mg | ORAL_TABLET | Freq: Every day | ORAL | Status: DC
  Administered 2020-10-28: 10 mg via ORAL
  Filled 2020-10-28: qty 2

## 2020-10-28 MED ORDER — HYDROXYZINE HCL 25 MG PO TABS
25.0000 mg | ORAL_TABLET | Freq: Four times a day (QID) | ORAL | Status: DC | PRN
  Administered 2020-10-28 – 2020-10-29 (×2): 25 mg via ORAL
  Filled 2020-10-28 (×3): qty 1

## 2020-10-28 NOTE — ED Notes (Signed)
Pt requesting update on when she will meet with TTS today  Ambulatory to restroom independently.   Pt husband updated on plan of care

## 2020-10-28 NOTE — ED Notes (Signed)
Pt requesting that Dr. Elta Guadeloupe not be given any further information about her status. She filled out a information consent for her therapist but does not want this MD given any more information about her

## 2020-10-28 NOTE — ED Notes (Signed)
Husband, Kamorah Nevils [phone] would like an update when possible

## 2020-10-28 NOTE — ED Notes (Signed)
Patient made her 2nd phone call  For today.

## 2020-10-28 NOTE — ED Notes (Signed)
Patient at desk making phone call to [facility]

## 2020-10-28 NOTE — ED Notes (Signed)
Patient was given Cup of Ginger Ale and Bottle of Water.

## 2020-10-28 NOTE — ED Notes (Signed)
Pt resting comfortably in her room. Using coloring book and crayons Calm and cooperative at this time

## 2020-10-28 NOTE — BH Assessment (Signed)
Disposition: Pt has not been accepted to [facility] or other facility as of yet. No appropriate beds at [facility] [place] to follow up on 3/8, patient has not yet surpassed 24 hours. Received notice from Medical Director of [facility] (Dr. Hampton Abbot), that patient is ok to be re-faxed out due to no appropriate beds at [facility] for today. Patient re-faxed out to multiple facilities and under review for bed placement
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
## 2020-10-28 NOTE — ED Notes (Signed)
Pt on TTS call

## 2020-10-28 NOTE — ED Notes (Signed)
Dinner Tray Ordered @ 1700. 
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
## 2020-10-28 NOTE — ED Notes (Signed)
Lunch Tray Ordered @ 1023. 

## 2020-10-29 ENCOUNTER — Telehealth (HOSPITAL_COMMUNITY): Payer: Self-pay | Admitting: Psychiatry

## 2020-10-29 MED ORDER — FOLIC ACID 1 MG PO TABS
1.0000 mg | ORAL_TABLET | Freq: Every day | ORAL | Status: DC
  Administered 2020-10-29: 1 mg via ORAL
  Filled 2020-10-29: qty 1

## 2020-10-29 MED ORDER — HYDROXYCHLOROQUINE SULFATE 200 MG PO TABS
400.0000 mg | ORAL_TABLET | Freq: Every day | ORAL | Status: DC
  Administered 2020-10-29: 400 mg via ORAL
  Filled 2020-10-29: qty 2

## 2020-10-29 MED ORDER — LORAZEPAM 1 MG PO TABS: 2.0000 mg | ORAL_TABLET | Freq: Once | ORAL | Status: DC | PRN

## 2020-10-29 MED ORDER — DICLOFENAC SODIUM 1 % EX GEL
1.0000 "application " | Freq: Two times a day (BID) | CUTANEOUS | Status: DC | PRN
  Filled 2020-10-29: qty 100

## 2020-10-29 MED ORDER — PRAZOSIN HCL 1 MG PO CAPS: 1.0000 mg | ORAL_CAPSULE | Freq: Every day | ORAL | 0 refills | Status: AC

## 2020-10-29 MED ORDER — VITAMIN D 25 MCG (1000 UNIT) PO TABS
4000.0000 [IU] | ORAL_TABLET | Freq: Every day | ORAL | Status: DC
  Administered 2020-10-29: 4000 [IU] via ORAL
  Filled 2020-10-29: qty 4

## 2020-10-29 MED ORDER — OLANZAPINE 10 MG PO TABS: 10.0000 mg | ORAL_TABLET | Freq: Every day | ORAL | 0 refills | Status: AC

## 2020-10-29 MED ORDER — ASCORBIC ACID 500 MG PO TABS
1000.0000 mg | ORAL_TABLET | Freq: Every day | ORAL | Status: DC
  Administered 2020-10-29: 1000 mg via ORAL
  Filled 2020-10-29: qty 2

## 2020-10-29 MED ORDER — OMEGA-3-ACID ETHYL ESTERS 1 G PO CAPS
1000.0000 mg | ORAL_CAPSULE | Freq: Every day | ORAL | Status: DC
  Administered 2020-10-29: 1000 mg via ORAL
  Filled 2020-10-29: qty 1

## 2020-10-29 NOTE — ED Notes (Signed)
Tele psych machine to bedside for assessment.  
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
## 2020-10-29 NOTE — ED Notes (Signed)
EDP to rescind IVC
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
## 2020-10-29 NOTE — ED Notes (Signed)
Lunch Tray Ordered @ 1015. 
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
## 2020-10-29 NOTE — ED Notes (Signed)
Pt at the desk making a phone call.
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
## 2020-10-29 NOTE — ED Notes (Signed)
Behavior Health counselor at bedside.

## 2020-10-29 NOTE — ED Notes (Signed)
Patient has increased anxiety due to events in unit with another patient; Female sitter in unit has stepped into the room to sit with patient while RN and staff deals with another escalated patient; Patient is visibly shaken and scared. Patient asked for 2 mg of ativan...EDP notified of request-Monique,RN

## 2020-10-29 NOTE — Progress Notes (Signed)
Patient information has been sent to [facility] via secure chat to review for potential admission. Patient meets inpatient criteria per Harriett Sine, NP.   Situation ongoing, CSW will continue to monitor progress.    Signed:  Durenda Hurt, MSW, [place], LCASA 10/29/2020 10:36 AM

## 2020-10-29 NOTE — ED Notes (Signed)
Pt at nurses station using phone to talk to husband. Will continue to monitor

## 2020-11-01 ENCOUNTER — Other Ambulatory Visit: Payer: Self-pay | Admitting: Rheumatology

## 2020-11-01 DIAGNOSIS — F29 Unspecified psychosis not due to a substance or known physiological condition: Secondary | ICD-10-CM

## 2020-11-01 DIAGNOSIS — M329 Systemic lupus erythematosus, unspecified: Secondary | ICD-10-CM

## 2020-11-11 ENCOUNTER — Other Ambulatory Visit: Payer: Self-pay | Admitting: Rheumatology

## 2020-11-11 DIAGNOSIS — F29 Unspecified psychosis not due to a substance or known physiological condition: Secondary | ICD-10-CM

## 2020-11-11 DIAGNOSIS — M329 Systemic lupus erythematosus, unspecified: Secondary | ICD-10-CM

## 2020-11-13 ENCOUNTER — Ambulatory Visit: Payer: BC Managed Care – PPO

## 2020-11-28 ENCOUNTER — Ambulatory Visit
Admission: RE | Admit: 2020-11-28 | Discharge: 2020-11-28 | Disposition: A | Discharge status: Home / Self Care | Payer: BC Managed Care – PPO | Source: Ambulatory Visit | Attending: Rheumatology | Admitting: Rheumatology

## 2020-11-28 DIAGNOSIS — M329 Systemic lupus erythematosus, unspecified: Secondary | ICD-10-CM

## 2020-11-28 DIAGNOSIS — F29 Unspecified psychosis not due to a substance or known physiological condition: Secondary | ICD-10-CM

## 2020-11-28 MED ORDER — GADOBENATE DIMEGLUMINE 529 MG/ML IV SOLN
17.0000 mL | Freq: Once | INTRAVENOUS | Status: AC | PRN
  Administered 2020-11-28: 17 mL via INTRAVENOUS

## 2021-01-02 ENCOUNTER — Telehealth (HOSPITAL_COMMUNITY): Payer: Self-pay | Admitting: Licensed Clinical Social Worker

## 2021-01-14 ENCOUNTER — Telehealth (HOSPITAL_COMMUNITY): Payer: Self-pay | Admitting: Psychiatry

## 2021-01-14 NOTE — Telephone Encounter (Signed)
D:  Pt had called Micheline Chapman, LCSW, LCAS re: CD-IOP.  A:  Returned pt's call and oriented her.  Pt is scheduled to see Oneita Kras on 01-23-21 @ 9 a.m. Instructed pt to arrive at 8:30 a.m to do paperwork, bring insurance card and wear a mask.  R:  Pt receptive.

## 2021-01-23 ENCOUNTER — Ambulatory Visit (HOSPITAL_COMMUNITY): Payer: BC Managed Care – PPO | Admitting: Licensed Clinical Social Worker

## 2021-01-23 ENCOUNTER — Telehealth (HOSPITAL_COMMUNITY): Payer: Self-pay | Admitting: Licensed Clinical Social Worker

## 2021-01-28 ENCOUNTER — Ambulatory Visit (INDEPENDENT_AMBULATORY_CARE_PROVIDER_SITE_OTHER): Payer: BC Managed Care – PPO | Admitting: Licensed Clinical Social Worker

## 2021-01-28 ENCOUNTER — Other Ambulatory Visit: Payer: Self-pay

## 2021-01-28 DIAGNOSIS — F4310 Post-traumatic stress disorder, unspecified: Secondary | ICD-10-CM | POA: Diagnosis not present

## 2021-01-28 DIAGNOSIS — F333 Major depressive disorder, recurrent, severe with psychotic symptoms: Secondary | ICD-10-CM

## 2021-01-29 ENCOUNTER — Telehealth (HOSPITAL_COMMUNITY): Payer: Self-pay | Admitting: Psychiatry
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
## 2021-02-17 ENCOUNTER — Other Ambulatory Visit (HOSPITAL_COMMUNITY): Payer: BC Managed Care – PPO | Admitting: Psychiatry

## 2021-02-17 ENCOUNTER — Other Ambulatory Visit: Payer: Self-pay

## 2021-02-18 ENCOUNTER — Ambulatory Visit (HOSPITAL_COMMUNITY): Payer: BC Managed Care – PPO

## 2021-02-19 ENCOUNTER — Ambulatory Visit (HOSPITAL_COMMUNITY): Payer: BC Managed Care – PPO

## 2021-02-20 ENCOUNTER — Ambulatory Visit (HOSPITAL_COMMUNITY): Payer: BC Managed Care – PPO

## 2021-02-21 ENCOUNTER — Ambulatory Visit (HOSPITAL_COMMUNITY): Payer: BC Managed Care – PPO

## 2021-02-25 ENCOUNTER — Ambulatory Visit (HOSPITAL_COMMUNITY): Payer: BC Managed Care – PPO

## 2021-02-26 ENCOUNTER — Ambulatory Visit (HOSPITAL_COMMUNITY): Payer: BC Managed Care – PPO

## 2021-02-27 ENCOUNTER — Ambulatory Visit (HOSPITAL_COMMUNITY): Payer: BC Managed Care – PPO

## 2021-02-27 ENCOUNTER — Other Ambulatory Visit: Payer: Self-pay | Admitting: Internal Medicine

## 2021-02-28 ENCOUNTER — Ambulatory Visit (HOSPITAL_COMMUNITY): Payer: BC Managed Care – PPO

## 2021-03-03 ENCOUNTER — Ambulatory Visit (HOSPITAL_COMMUNITY): Payer: BC Managed Care – PPO

## 2021-03-04 ENCOUNTER — Ambulatory Visit (HOSPITAL_COMMUNITY): Payer: BC Managed Care – PPO

## 2021-03-05 ENCOUNTER — Ambulatory Visit (HOSPITAL_COMMUNITY): Payer: BC Managed Care – PPO

## 2021-03-06 ENCOUNTER — Ambulatory Visit (HOSPITAL_COMMUNITY): Payer: BC Managed Care – PPO

## 2021-03-07 ENCOUNTER — Ambulatory Visit (HOSPITAL_COMMUNITY): Payer: BC Managed Care – PPO

## 2021-03-13 ENCOUNTER — Telehealth (HOSPITAL_COMMUNITY): Payer: Self-pay | Admitting: Psychiatry

## 2021-03-13 NOTE — Telephone Encounter (Signed)
D:  Pt phoned stating that she would like to start [place].  Reports she was having internet issues and that's why she couldn't start last month.  A:  Re-oriented pt.  Pt will start 03-20-21 @ 9 a.m.  R:  Pt receptive.

## 2021-03-20 ENCOUNTER — Ambulatory Visit (HOSPITAL_COMMUNITY): Payer: BC Managed Care – PPO

## 2021-03-21 ENCOUNTER — Ambulatory Visit (HOSPITAL_COMMUNITY): Payer: BC Managed Care – PPO

## 2021-03-24 ENCOUNTER — Ambulatory Visit (HOSPITAL_COMMUNITY): Payer: BC Managed Care – PPO

## 2021-03-25 ENCOUNTER — Ambulatory Visit (HOSPITAL_COMMUNITY): Payer: BC Managed Care – PPO

## 2021-03-26 ENCOUNTER — Ambulatory Visit (HOSPITAL_COMMUNITY): Payer: BC Managed Care – PPO

## 2021-03-27 ENCOUNTER — Ambulatory Visit (HOSPITAL_COMMUNITY): Payer: BC Managed Care – PPO

## 2021-03-28 ENCOUNTER — Ambulatory Visit (HOSPITAL_COMMUNITY): Payer: BC Managed Care – PPO

## 2021-03-31 ENCOUNTER — Ambulatory Visit (HOSPITAL_COMMUNITY): Payer: BC Managed Care – PPO

## 2021-04-01 ENCOUNTER — Ambulatory Visit (HOSPITAL_COMMUNITY): Payer: BC Managed Care – PPO

## 2021-04-02 ENCOUNTER — Ambulatory Visit (HOSPITAL_COMMUNITY): Payer: BC Managed Care – PPO

## 2021-04-03 ENCOUNTER — Ambulatory Visit (HOSPITAL_COMMUNITY): Payer: BC Managed Care – PPO

## 2021-04-04 ENCOUNTER — Ambulatory Visit (HOSPITAL_COMMUNITY): Payer: BC Managed Care – PPO

## 2021-04-07 ENCOUNTER — Ambulatory Visit (HOSPITAL_COMMUNITY): Payer: BC Managed Care – PPO

## 2021-05-13 ENCOUNTER — Other Ambulatory Visit: Payer: Self-pay | Admitting: Internal Medicine

## 2021-07-02 ENCOUNTER — Emergency Department (HOSPITAL_COMMUNITY)
Admission: EM | Admit: 2021-07-02 | Discharge: 2021-07-03 | Disposition: A | Discharge status: Home / Self Care | Payer: BC Managed Care – PPO | Attending: Emergency Medicine | Admitting: Emergency Medicine

## 2021-07-02 ENCOUNTER — Encounter (HOSPITAL_COMMUNITY): Payer: Self-pay

## 2021-07-02 ENCOUNTER — Other Ambulatory Visit: Payer: Self-pay

## 2021-07-02 ENCOUNTER — Emergency Department (HOSPITAL_COMMUNITY): Payer: BC Managed Care – PPO

## 2021-07-02 DIAGNOSIS — R55 Syncope and collapse: Secondary | ICD-10-CM | POA: Diagnosis not present

## 2021-07-02 DIAGNOSIS — R519 Headache, unspecified: Secondary | ICD-10-CM | POA: Diagnosis not present

## 2021-07-02 DIAGNOSIS — R1110 Vomiting, unspecified: Secondary | ICD-10-CM | POA: Diagnosis not present

## 2021-07-02 DIAGNOSIS — E876 Hypokalemia: Secondary | ICD-10-CM

## 2021-07-02 DIAGNOSIS — Z79899 Other long term (current) drug therapy: Secondary | ICD-10-CM | POA: Insufficient documentation

## 2021-07-02 LAB — COMPREHENSIVE METABOLIC PANEL
ALT: 29 U/L (ref 0–44)
AST: 32 U/L (ref 15–41)
Albumin: 4 g/dL (ref 3.5–5.0)
Alkaline Phosphatase: 61 U/L (ref 38–126)
Anion gap: 10 (ref 5–15)
BUN: 12 mg/dL (ref 6–20)
CO2: 21 mmol/L — ABNORMAL LOW (ref 22–32)
Calcium: 8.3 mg/dL — ABNORMAL LOW (ref 8.9–10.3)
Chloride: 106 mmol/L (ref 98–111)
Creatinine, Ser: 0.41 mg/dL — ABNORMAL LOW (ref 0.44–1.00)
GFR, Estimated: >60 mL/min (ref >60)
Glucose, Bld: 101 mg/dL — ABNORMAL HIGH (ref 70–99)
Potassium: 2.9 mmol/L — ABNORMAL LOW (ref 3.5–5.1)
Sodium: 137 mmol/L (ref 135–145)
Total Bilirubin: 0.3 mg/dL (ref 0.3–1.2)
Total Protein: 7.5 g/dL (ref 6.5–8.1)

## 2021-07-02 LAB — CBC WITH DIFFERENTIAL/PLATELET
Abs Immature Granulocytes: 0.04 10*3/uL (ref 0.00–0.07)
Basophils Absolute: 0 10*3/uL (ref 0.0–0.1)
Basophils Relative: 0 %
Eosinophils Absolute: 0.1 10*3/uL (ref 0.0–0.5)
Eosinophils Relative: 1 %
HCT: 42.8 % (ref 36.0–46.0)
Hemoglobin: 14.5 g/dL (ref 12.0–15.0)
Immature Granulocytes: 0 %
Lymphocytes Relative: 14 %
Lymphs Abs: 1.7 10*3/uL (ref 0.7–4.0)
MCH: 29.5 pg (ref 26.0–34.0)
MCHC: 33.9 g/dL (ref 30.0–36.0)
MCV: 87.2 fL (ref 80.0–100.0)
Monocytes Absolute: 0.6 10*3/uL (ref 0.1–1.0)
Monocytes Relative: 5 %
Neutro Abs: 9.2 10*3/uL — ABNORMAL HIGH (ref 1.7–7.7)
Neutrophils Relative %: 80 %
Platelets: UNDETERMINED 10*3/uL (ref 150–400)
RBC: 4.91 MIL/uL (ref 3.87–5.11)
RDW: 13.2 % (ref 11.5–15.5)
WBC: 11.6 10*3/uL — ABNORMAL HIGH (ref 4.0–10.5)
nRBC: 0 % (ref 0.0–0.2)

## 2021-07-02 LAB — TROPONIN I (HIGH SENSITIVITY): Troponin I (High Sensitivity): <2 ng/L (ref <18)

## 2021-07-02 LAB — MAGNESIUM: Magnesium: 2 mg/dL (ref 1.7–2.4)

## 2021-07-02 LAB — PREGNANCY, URINE: Preg Test, Ur: NEGATIVE

## 2021-07-02 MED ORDER — ONDANSETRON HCL 4 MG/2ML IJ SOLN
4.0000 mg | Freq: Once | INTRAMUSCULAR | Status: AC
  Administered 2021-07-02: 4 mg via INTRAVENOUS
  Filled 2021-07-02: qty 2

## 2021-07-02 MED ORDER — SODIUM CHLORIDE 0.9 % IV BOLUS
1000.0000 mL | Freq: Once | INTRAVENOUS | Status: AC
  Administered 2021-07-02: 1000 mL via INTRAVENOUS

## 2021-07-02 MED ORDER — ACETAMINOPHEN 325 MG PO TABS
650.0000 mg | ORAL_TABLET | Freq: Once | ORAL | Status: AC
  Administered 2021-07-02: 650 mg via ORAL
  Filled 2021-07-02: qty 2

## 2021-07-02 MED ORDER — POTASSIUM CHLORIDE CRYS ER 20 MEQ PO TBCR
40.0000 meq | EXTENDED_RELEASE_TABLET | Freq: Once | ORAL | Status: AC
  Administered 2021-07-02: 40 meq via ORAL
  Filled 2021-07-02: qty 2

## 2021-07-02 NOTE — ED Notes (Signed)
Pt ambulated to the BR with standby assistance. Pt denied dizziness. Gait steady

## 2021-07-02 NOTE — ED Notes (Signed)
Pt transported to CT ?

## 2021-07-02 NOTE — ED Provider Notes (Addendum)
[facility] DEPT Provider Note   CSN: [id number] Arrival date & time: 07/02/21  2119     History Chief Complaint  Patient presents with   Loss of Consciousness   Emesis    Kathleen Ashley is a 40 y.o. female.  Patient presents chief complaint of a syncopal episode.  She says she was at home feeling fine all day but this afternoon she was in the kitchen and she started to feel "funny."  Next and she knew she was waking up on the ground.  She is not quite sure what happened.  Denies any preceding headache or chest pain.  No recent illnesses such as fevers or cough or diarrhea.  She states that she took 2 cups of a new tea described as an herbal tea called Kava.  She thinks she hit her head and complaining of a headache on the left side of her head now.  She had 2 episodes of vomiting afterwards.      Past Medical History:  Diagnosis Date   Alopecia    Anemia    Anticardiolipin antibody positive    DDD (degenerative disc disease)    Depression    Endometriosis    Fibroid    Low back pain    Lupus ([place])    Lupus (systemic lupus erythematosus) ([place])    Seizure ([place])    last seizure was  11-03-18, previous to that  seizures were in childhood. 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   Anxiety 12/05/2018   New onset seizure ([place]) 12/05/2018   Diarrhea 10/25/2018   Routine general medical examination at a health care facility 04/07/2018   S/P lumbar spinal fusion 10/08/2017   Systemic lupus erythematosus ([place]) 10/20/2016   Vitamin D deficiency 10/20/2016   History of gastric bypass 10/20/2016   Encounter for genetic counseling 02/13/2016    Past Surgical History:  Procedure Laterality Date   ABDOMINAL EXPOSURE N/A 10/08/2017   Procedure: ABDOMINAL EXPOSURE;  Surgeon: Rosetta Posner, MD;  Location: [facility] OR;  Service: Vascular;  Laterality: N/A;   abdominal surgery     "tummy tuck"   ANTERIOR LUMBAR FUSION N/A 10/08/2017   Procedure: Anterior Lumbar Interbody Fusion  - Lumbar five Sacral one;  Surgeon: Eustace Moore, MD;  Location: [facility];  Service: Neurosurgery;  Laterality: N/A;   BACK SURGERY     BREAST SURGERY     breast reduction   DILATION AND CURETTAGE OF UTERUS     GASTRIC BYPASS     LAPAROSCOPIC GELPORT ASSISTED MYOMECTOMY N/A 02/21/2019   Procedure: LAPAROSCOPIC GELPORT ASSISTED MYOMECTOMY, EXCISION OF ENDOMETRIOSIS, LYSIS OF ADHESIONS, CHROMOTUBERATION;  Surgeon: Governor Specking, MD;  Location[facility];  Service: Gynecology;  Laterality: N/A;   LUMBAR LAMINECTOMY     L5-S1 on the right     OB History     Gravida  1   Para  0   Term  0   Preterm  0   AB  1   Living  0      SAB  0   IAB  1   Ectopic  0   Multiple      Live Births              Family History  Problem  Relation Age of Onset   Diabetes Sister     Social History   Tobacco Use   Smoking status: Never   Smokeless tobacco: Never  Vaping Use   Vaping Use: Never used  Substance Use Topics   Alcohol use: No   Drug use: No    Comment: 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 Authorizing Provider  5-Hydroxytryptophan (5-HTP) 100 MG CAPS Take 200 mg by mouth daily.    [provider]  Acetylcarn-Alpha Lipoic Acid 400-200 MG CAPS Take 1 capsule by mouth daily.    [provider]  Ascorbic Acid (VITAMIN C) 1000 MG tablet Take 1,000 mg by mouth daily.    [provider]  buprenorphine (SUBUTEX) 8 MG SUBL SL tablet Place 8 mg under the tongue every evening. 10/10/20   [provider]  cholecalciferol (VITAMIN D3) 25 MCG (1000 UNIT) tablet Take 4,000 Units by mouth daily.    [provider]  cyclobenzaprine  (FLEXERIL) 10 MG tablet TAKE 1 TABLET(10 MG) BY MOUTH THREE TIMES DAILY AS NEEDED FOR MUSCLE SPASMS Patient taking differently: Take 10 mg by mouth 2 (two) times daily as needed for muscle spasms. 08/22/20   Hoyt Koch, MD  Diclofenac Sodium 1.5 % SOLN Apply 1 application topically 2 (two) times daily as needed (pain/lupus flares).    [provider]  folic acid (FOLVITE) 235 MCG tablet Take 800 mcg by mouth daily.    [provider]  hydroxychloroquine (PLAQUENIL) 200 MG tablet Take 400 mg by mouth daily.    [provider]  ibuprofen (ADVIL,MOTRIN) 800 MG tablet Take 1 tablet (800 mg total) by mouth 3 (three) times daily. Patient taking differently: Take 800 mg by mouth 2 (two) times daily as needed for headache or moderate pain. 08/31/15   Muthersbaugh, Jarrett Soho, PA-C  L-Arginine 500 MG CAPS Take 1,000 mg by mouth daily.    [provider]  OLANZapine (ZYPREXA) 10 MG tablet Take 1 tablet (10 mg total) by mouth at bedtime. 10/29/20   Connye Burkitt, NP  Omega-3 1000 MG CAPS Take 1,000 mg by mouth daily.    [provider]  prazosin (MINIPRESS) 1 MG capsule Take 1 capsule (1 mg total) by mouth at bedtime. 10/29/20   Connye Burkitt, NP  predniSONE (DELTASONE) 10 MG tablet Take 10-30 mg by mouth See admin instructions. Take 10-30 mg by mouth as a tapered course as needed for lupus flares - 3 tablets (30 mg) daily for 2 days, then take 2 tablets (20 mg) daily for 2 days, then take 1 tablet (10 mg) daily for 2 days, then take 1/2 tablet (5 mg) daily for 2 days, then take 1/4 tablet (2.5 mg) daily for 2 days, then stop    [provider]  SPIRULINA PO Take 400 mg by mouth daily.    [provider]  traMADol (ULTRAM) 50 MG tablet Take 50 mg by mouth 5 (five) times daily as needed (lupus flare). 10/13/20   [provider]  Turmeric Curcumin 500 MG CAPS Take 500 mg by mouth daily.    [provider]    Allergies    Patient  has no known allergies.  Review of Systems   Review of Systems  Constitutional:  Negative for fever.  HENT:  Negative for ear pain.   Eyes:  Negative for pain.  Respiratory:  Negative for  cough.   Cardiovascular:  Negative for chest pain.  Gastrointestinal:  Negative for abdominal pain.  Genitourinary:  Negative for flank pain.  Musculoskeletal:  Negative for back pain.  Skin:  Negative for rash.  Neurological:  Positive for headaches.   Physical Exam Updated Vital Signs BP 122/71   Pulse 74   Temp 97.9 F (36.6 C) (Oral)   Resp 19   SpO2 100%   Physical Exam Constitutional:      General: She is not in acute distress.    Appearance: Normal appearance.  HENT:     Head: Normocephalic.     Comments: Soft tissue swelling and tenderness in the left parietal region.  No laceration noted.    Nose: Nose normal.  Eyes:     Extraocular Movements: Extraocular movements intact.  Cardiovascular:     Rate and Rhythm: Normal rate.  Pulmonary:     Effort: Pulmonary effort is normal.  Musculoskeletal:        General: Normal range of motion.     Cervical back: Normal range of motion.     Comments: No C or T or L-spine midline step-offs or tenderness.  Ranging her bilateral shoulders elbows wrist without pain or discomfort.  Bilateral hips and ankles and knees without pain or discomfort.  Neurological:     General: No focal deficit present.     Mental Status: She is alert and oriented to person, place, and time. Mental status is at baseline.     Cranial Nerves: No cranial nerve deficit.     Motor: No weakness.     Gait: Gait normal.    ED Results / Procedures / Treatments   Labs (all labs ordered are listed, but only abnormal results are displayed) Labs Reviewed  CBC WITH DIFFERENTIAL/PLATELET - Abnormal; Notable for the following components:      Result Value   WBC 11.6 (*)    Neutro Abs 9.2 (*)    All other components within normal limits  COMPREHENSIVE METABOLIC PANEL -  Abnormal; Notable for the following components:   Potassium 2.9 (*)    CO2 21 (*)    Glucose, Bld 101 (*)    Creatinine, Ser 0.41 (*)    Calcium 8.3 (*)    All other components within normal limits  MAGNESIUM  PREGNANCY, URINE  HCG, QUANTITATIVE, PREGNANCY  TROPONIN I (HIGH SENSITIVITY)    EKG EKG Interpretation  Date/Time:  Wednesday July 02 2021 21:51:18 EST Ventricular Rate:  87 PR Interval:  166 QRS Duration: 93 QT Interval:  403 QTC Calculation: 485 R Axis:   46 Text Interpretation: Sinus rhythm Consider right atrial enlargement Confirmed by Thamas Jaegers ([id number]) on 07/02/2021 11:35:39 PM  Radiology CT Head Wo Contrast  Result Date: 07/02/2021 CLINICAL DATA:  Head trauma passed out EXAM: CT HEAD WITHOUT CONTRAST TECHNIQUE: Contiguous axial images were obtained from the base of the skull through the vertex without intravenous contrast. COMPARISON:  MRI 11/28/2020 FINDINGS: Brain: No evidence of acute infarction, hemorrhage, hydrocephalus, extra-axial collection or mass lesion/mass effect. Vascular: No hyperdense vessel or unexpected calcification. Skull: Normal. Negative for fracture or focal lesion. Sinuses/Orbits: No acute finding. Other: None IMPRESSION: Negative non contrasted CT appearance of the brain Electronically Signed   By: Donavan Foil M.D.   On: 07/02/2021 22:43    Procedures Procedures   Medications Ordered in ED Medications  sodium chloride 0.9 % bolus 1,000 mL (1,000 mLs Intravenous New Bag/Given 07/02/21 2205)  ondansetron (ZOFRAN) injection 4 mg (4 mg  Intravenous Given 07/02/21 2205)  acetaminophen (TYLENOL) tablet 650 mg (650 mg Oral Given 07/02/21 2243)  potassium chloride SA (KLOR-CON) CR tablet 40 mEq (40 mEq Oral Given 07/02/21 2340)    ED Course  I have reviewed the triage vital signs and the nursing notes.  Pertinent labs & imaging results that were available during my care of the patient were reviewed by me and considered in my medical decision  making (see chart for details).    MDM Rules/Calculators/A&P                           Lab work unremarkable potassium slightly low at 2.9 and given repletion here in the ER.  EKG shows sinus rhythm normal rate no ST elevations depressions, mildly elevated QTC of 485.  Patient given IV fluids and potassium repletion and Tylenol.  Will recommend outpatient follow-up with her doctor in 2 to 3 days, recommending rest and increase fluid intake at home.  Advised immediate return for recurrent episodes pain or any additional concerns.   Final Clinical Impression(s) / ED Diagnoses Final diagnoses:  Syncope and collapse  Hypokalemia    Rx / DC Orders ED Discharge Orders     None        Luna Fuse, MD 07/02/21 2359    Luna Fuse, MD 07/02/21 2359

## 2021-07-02 NOTE — ED Notes (Signed)
Recollected light green blood tube and sent down with it: 2 gold, 1 lavender, 1 dark green, 1 blue and 1 grey on ice save tubes

## 2021-07-02 NOTE — Discharge Instructions (Addendum)
Call your primary care doctor or specialist as discussed in the next 2-3 days.   Return immediately back to the ER if:  Your symptoms worsen within the next 12-24 hours. You develop new symptoms such as new fevers, persistent vomiting, new pain, shortness of breath, or new weakness or numbness, or if you have any other concerns.  

## 2021-07-02 NOTE — ED Triage Notes (Signed)
Pt BIB EMS from home. Pt started taking Kava Kava root yesterday. Pt states that she took more than recommended on the bottle around 8pm. Pt states she was feeling nauseous, went to the kitchen to get something to drink, and when she stood she passed out. Husband states that she was unconscious about a minute. Pt states that left shoulder and back of head hurts. EMS gave 4mg  zofran en route, pt states that this helped but is still nauseous.

## 2022-07-23 ENCOUNTER — Other Ambulatory Visit: Payer: Self-pay | Admitting: Obstetrics and Gynecology

## 2022-07-23 DIAGNOSIS — Z363 Encounter for antenatal screening for malformations: Secondary | ICD-10-CM

## 2022-07-23 DIAGNOSIS — M329 Systemic lupus erythematosus, unspecified: Secondary | ICD-10-CM

## 2022-07-23 DIAGNOSIS — O09521 Supervision of elderly multigravida, first trimester: Secondary | ICD-10-CM

## 2022-08-04 ENCOUNTER — Encounter: Payer: Self-pay | Admitting: *Deleted

## 2022-08-10 ENCOUNTER — Ambulatory Visit: Payer: BC Managed Care – PPO

## 2022-09-24 DIAGNOSIS — Z419 Encounter for procedure for purposes other than remedying health state, unspecified: Secondary | ICD-10-CM | POA: Diagnosis not present

## 2022-10-23 DIAGNOSIS — Z419 Encounter for procedure for purposes other than remedying health state, unspecified: Secondary | ICD-10-CM | POA: Diagnosis not present

## 2022-11-23 DIAGNOSIS — Z419 Encounter for procedure for purposes other than remedying health state, unspecified: Secondary | ICD-10-CM | POA: Diagnosis not present

## 2022-12-23 DIAGNOSIS — Z419 Encounter for procedure for purposes other than remedying health state, unspecified: Secondary | ICD-10-CM | POA: Diagnosis not present

## 2023-01-23 DIAGNOSIS — Z419 Encounter for procedure for purposes other than remedying health state, unspecified: Secondary | ICD-10-CM | POA: Diagnosis not present

## 2023-02-22 DIAGNOSIS — Z419 Encounter for procedure for purposes other than remedying health state, unspecified: Secondary | ICD-10-CM | POA: Diagnosis not present
